# Patient Record
Sex: Female | Born: 1960 | Race: Black or African American | Hispanic: No | Marital: Married | Smoking: Current every day smoker
Health system: Southern US, Community
[De-identification: ages and names within clinical notes are randomized; demographics above are authoritative.]

## PROBLEM LIST (undated history)

## (undated) DIAGNOSIS — I10 Essential (primary) hypertension: Secondary | ICD-10-CM

## (undated) DIAGNOSIS — R42 Dizziness and giddiness: Secondary | ICD-10-CM

## (undated) DIAGNOSIS — M1990 Unspecified osteoarthritis, unspecified site: Secondary | ICD-10-CM

## (undated) DIAGNOSIS — I499 Cardiac arrhythmia, unspecified: Secondary | ICD-10-CM

## (undated) HISTORY — DX: Unspecified osteoarthritis, unspecified site: M19.90

## (undated) HISTORY — PX: MOLE REMOVAL: SHX2046

## (undated) HISTORY — DX: Cardiac arrhythmia, unspecified: I49.9

---

## 1998-10-07 ENCOUNTER — Encounter: Admission: RE | Admit: 1998-10-07 | Discharge: 1998-10-07 | Payer: Self-pay | Admitting: Internal Medicine

## 1998-12-31 ENCOUNTER — Other Ambulatory Visit: Admission: RE | Admit: 1998-12-31 | Discharge: 1998-12-31 | Payer: Self-pay | Admitting: Obstetrics & Gynecology

## 2000-02-06 ENCOUNTER — Inpatient Hospital Stay (HOSPITAL_COMMUNITY): Admission: AD | Admit: 2000-02-06 | Discharge: 2000-02-06 | Payer: Self-pay | Admitting: Obstetrics

## 2003-09-22 ENCOUNTER — Emergency Department (HOSPITAL_COMMUNITY): Admission: EM | Admit: 2003-09-22 | Discharge: 2003-09-22 | Payer: Self-pay | Admitting: Emergency Medicine

## 2010-05-04 ENCOUNTER — Emergency Department (HOSPITAL_COMMUNITY): Admission: EM | Admit: 2010-05-04 | Discharge: 2010-05-04 | Payer: Self-pay | Admitting: Emergency Medicine

## 2010-10-15 LAB — CBC
MCH: 31.2 pg (ref 26.0–34.0)
MCHC: 34.1 g/dL (ref 30.0–36.0)
Platelets: 270 10*3/uL (ref 150–400)
RBC: 4.49 MIL/uL (ref 3.87–5.11)

## 2010-10-15 LAB — POCT I-STAT, CHEM 8
HCT: 44 % (ref 36.0–46.0)
Hemoglobin: 15 g/dL (ref 12.0–15.0)
Sodium: 135 mEq/L (ref 135–145)
TCO2: 21 mmol/L (ref 0–100)

## 2010-10-15 LAB — DIFFERENTIAL
Basophils Relative: 0 % (ref 0–1)
Eosinophils Absolute: 0.2 10*3/uL (ref 0.0–0.7)
Lymphs Abs: 2.2 10*3/uL (ref 0.7–4.0)
Neutrophils Relative %: 73 % (ref 43–77)

## 2010-10-15 LAB — GLUCOSE, CAPILLARY: Glucose-Capillary: 116 mg/dL — ABNORMAL HIGH (ref 70–99)

## 2012-06-15 ENCOUNTER — Encounter (HOSPITAL_COMMUNITY): Payer: Self-pay | Admitting: Emergency Medicine

## 2012-06-15 ENCOUNTER — Emergency Department (INDEPENDENT_AMBULATORY_CARE_PROVIDER_SITE_OTHER)
Admission: EM | Admit: 2012-06-15 | Discharge: 2012-06-15 | Disposition: A | Discharge status: Home / Self Care | Payer: Self-pay | Source: Home / Self Care | Attending: Emergency Medicine | Admitting: Emergency Medicine

## 2012-06-15 DIAGNOSIS — R21 Rash and other nonspecific skin eruption: Secondary | ICD-10-CM

## 2012-06-15 MED ORDER — CETIRIZINE HCL 10 MG PO CHEW: 10.0000 mg | CHEWABLE_TABLET | Freq: Every day | ORAL | Status: DC

## 2012-06-15 MED ORDER — PERMETHRIN 5 % EX CREA: TOPICAL_CREAM | CUTANEOUS | Status: DC

## 2012-06-15 MED ORDER — IPRATROPIUM BROMIDE 0.02 % IN SOLN: 0.5000 mg | Freq: Once | RESPIRATORY_TRACT | Status: DC

## 2012-06-15 MED ORDER — PREDNISONE 20 MG PO TABS: 60.0000 mg | ORAL_TABLET | Freq: Every day | ORAL | Status: DC

## 2012-06-15 MED ORDER — ALBUTEROL SULFATE (5 MG/ML) 0.5% IN NEBU: 2.5000 mg | INHALATION_SOLUTION | Freq: Once | RESPIRATORY_TRACT | Status: DC

## 2012-06-15 MED ORDER — DIPHENHYDRAMINE-ZINC ACETATE 1-0.1 % EX CREA: TOPICAL_CREAM | Freq: Three times a day (TID) | CUTANEOUS | Status: DC | PRN

## 2012-06-15 NOTE — ED Notes (Signed)
Areas of itching skin for 4 days.  Spouse is itching, but not as bad as patient, no rash noticed.  Patient has small bumps scattered on trunk and legs.  Areas itch.  Patient has bumps on fingers

## 2012-06-15 NOTE — ED Provider Notes (Signed)
History     CSN: [id number]  Arrival date & time 06/15/12  1547   First MD Initiated Contact with Patient 06/15/12 1551      Chief Complaint  Patient presents with  . Pruritis    (Consider location/radiation/quality/duration/timing/severity/associated sxs/prior treatment) The history is provided by the patient.  This patient complains of a pruritic rash.  States she believes it is related to an old blanket she took out of her brothers car.   Location: bilateral forearms, chest and bilateral inner thigh  Onset: 4 days ago   Course: unchanged Self-treated with: anti-itch cream             Improvement with treatment: no  History Itching: yes  Tenderness: no  New medications/antibiotics: no  Pet exposure: no  Recent travel or tropical exposure: no  New soaps, shampoos, detergent, clothing: no Tick/insect exposure: no   Red Flags Feeling ill: no Fever:no Facial/tongue swelling/difficulty breathing:  no  Diabetic or immunocompromised: no   History reviewed. No pertinent past medical history.  History reviewed. No pertinent past surgical history.  No family history on file.  History  Substance Use Topics  . Smoking status: Current Every Day Smoker  . Smokeless tobacco: Not on file  . Alcohol Use: Yes    OB History    Grav Para Term Preterm Abortions TAB SAB Ect Mult Living                  Review of Systems  Skin: Positive for rash.  All other systems reviewed and are negative.    Allergies  Review of patient's allergies indicates no known allergies.  Home Medications   Current Outpatient Rx  Name  Route  Sig  Dispense  Refill  . CETIRIZINE HCL 10 MG PO CHEW   Oral   Chew 1 tablet (10 mg total) by mouth daily.   30 tablet   0   . DIPHENHYDRAMINE-ZINC ACETATE 1-0.1 % EX CREA   Topical   Apply topically 3 (three) times daily as needed for itching.   28.3 g   0   . PERMETHRIN 5 % EX CREA      Apply to affected area once   10 g   0      BP 153/91  Pulse 91  Temp 98 F (36.7 C) (Oral)  Resp 18  SpO2 99%  LMP 05/15/2012  Physical Exam  Nursing note and vitals reviewed. Constitutional: She is oriented to person, place, and time. Vital signs are normal. She appears well-developed and well-nourished. She is active and cooperative.  HENT:  Head: Normocephalic.  Eyes: Conjunctivae normal are normal. Pupils are equal, round, and reactive to light. No scleral icterus.  Neck: Trachea normal. Neck supple.  Cardiovascular: Normal rate, regular rhythm, normal heart sounds and intact distal pulses.   Pulmonary/Chest: Effort normal and breath sounds normal.  Neurological: She is alert and oriented to person, place, and time. No cranial nerve deficit or sensory deficit.  Skin: Skin is warm, dry and intact. Rash noted.       Diffuse papular erythematous and linear rash on arms and trunk  Psychiatric: She has a normal mood and affect. Her speech is normal and behavior is normal. Judgment and thought content normal. Cognition and memory are normal.    ED Course  Procedures (including critical care time)  Labs Reviewed - No data to display No results found.   1. Rash and nonspecific skin eruption  MDM  Cool showers; avoid heat, sunlight and anything that makes condition worse.  Zyrtec for at least seven days.  Begin benadryl cream.  RTC if symptoms do not improve or begin to have problems swallowing, breathing or significant change in condition.   Follow up with primary care provider for blood pressure management.       Johnsie Kindred, NP 06/15/12 1735  Johnsie Kindred, NP 06/15/12 1735

## 2012-06-16 NOTE — ED Provider Notes (Signed)
Medical screening examination/treatment/procedure(s) were performed by non-physician practitioner and as supervising physician I was immediately available for consultation/collaboration.  Leslee Home, M.D.   Reuben Likes, MD 06/16/12 [phone]

## 2016-08-18 ENCOUNTER — Encounter (HOSPITAL_COMMUNITY): Payer: Self-pay | Admitting: Emergency Medicine

## 2016-08-18 DIAGNOSIS — M542 Cervicalgia: Secondary | ICD-10-CM | POA: Insufficient documentation

## 2016-08-18 DIAGNOSIS — F17210 Nicotine dependence, cigarettes, uncomplicated: Secondary | ICD-10-CM | POA: Insufficient documentation

## 2016-08-18 DIAGNOSIS — R51 Headache: Secondary | ICD-10-CM | POA: Diagnosis not present

## 2016-08-18 NOTE — ED Triage Notes (Signed)
Pt states she slept all of Thursday night on her left shoulder which caused her to be very sore in that area; since Thursday, pain has began radiating to her neck and into her head; ambulatory and A&Ox4; Hypertensive in triage; states she has not been to the doctor in "10-15 years"; denies blurred vision

## 2016-08-19 ENCOUNTER — Emergency Department (HOSPITAL_COMMUNITY)
Admission: EM | Admit: 2016-08-19 | Discharge: 2016-08-19 | Disposition: A | Discharge status: Home / Self Care | Payer: Medicaid Other | Attending: Emergency Medicine | Admitting: Emergency Medicine

## 2016-08-19 ENCOUNTER — Encounter (HOSPITAL_COMMUNITY): Payer: Self-pay

## 2016-08-19 ENCOUNTER — Emergency Department (HOSPITAL_COMMUNITY): Payer: Medicaid Other

## 2016-08-19 DIAGNOSIS — M542 Cervicalgia: Secondary | ICD-10-CM

## 2016-08-19 DIAGNOSIS — R51 Headache: Secondary | ICD-10-CM

## 2016-08-19 DIAGNOSIS — R519 Headache, unspecified: Secondary | ICD-10-CM

## 2016-08-19 LAB — CBC WITH DIFFERENTIAL/PLATELET
Basophils Absolute: 0 10*3/uL (ref 0.0–0.1)
Basophils Relative: 0 %
EOS PCT: 2 %
Eosinophils Absolute: 0.2 10*3/uL (ref 0.0–0.7)
HCT: 47.6 % — ABNORMAL HIGH (ref 36.0–46.0)
Hemoglobin: 16.1 g/dL — ABNORMAL HIGH (ref 12.0–15.0)
LYMPHS ABS: 2.9 10*3/uL (ref 0.7–4.0)
LYMPHS PCT: 26 %
MCH: 32.3 pg (ref 26.0–34.0)
MCHC: 33.8 g/dL (ref 30.0–36.0)
MCV: 95.6 fL (ref 78.0–100.0)
MONO ABS: 0.7 10*3/uL (ref 0.1–1.0)
Monocytes Relative: 6 %
Neutro Abs: 7.3 10*3/uL (ref 1.7–7.7)
Neutrophils Relative %: 66 %
PLATELETS: 281 10*3/uL (ref 150–400)
RBC: 4.98 MIL/uL (ref 3.87–5.11)
RDW: 14.6 % (ref 11.5–15.5)
WBC: 11.1 10*3/uL — ABNORMAL HIGH (ref 4.0–10.5)

## 2016-08-19 LAB — I-STAT TROPONIN, ED: Troponin i, poc: 0 ng/mL (ref 0.00–0.08)

## 2016-08-19 LAB — BASIC METABOLIC PANEL
Anion gap: 9 (ref 5–15)
BUN: 8 mg/dL (ref 6–20)
CHLORIDE: 104 mmol/L (ref 101–111)
CO2: 26 mmol/L (ref 22–32)
CREATININE: 0.78 mg/dL (ref 0.44–1.00)
Calcium: 9.7 mg/dL (ref 8.9–10.3)
GFR calc non Af Amer: >60 mL/min (ref >60)
Glucose, Bld: 87 mg/dL (ref 65–99)
POTASSIUM: 3.5 mmol/L (ref 3.5–5.1)
Sodium: 139 mmol/L (ref 135–145)

## 2016-08-19 MED ORDER — IOPAMIDOL (ISOVUE-300) INJECTION 61%
75.0000 mL | Freq: Once | INTRAVENOUS | Status: AC | PRN
  Administered 2016-08-19: 75 mL via INTRAVENOUS

## 2016-08-19 MED ORDER — MORPHINE SULFATE (PF) 4 MG/ML IV SOLN
4.0000 mg | Freq: Once | INTRAVENOUS | Status: AC
  Administered 2016-08-19: 4 mg via INTRAVENOUS
  Filled 2016-08-19: qty 1

## 2016-08-19 MED ORDER — HYDROCODONE-ACETAMINOPHEN 5-325 MG PO TABS: 1.0000 | ORAL_TABLET | Freq: Four times a day (QID) | ORAL | 0 refills | Status: DC | PRN

## 2016-08-19 MED ORDER — IOPAMIDOL (ISOVUE-300) INJECTION 61%
INTRAVENOUS | Status: AC
  Administered 2016-08-19: 75 mL via INTRAVENOUS
  Filled 2016-08-19: qty 75

## 2016-08-19 MED ORDER — NAPROXEN 500 MG PO TABS: 500.0000 mg | ORAL_TABLET | Freq: Two times a day (BID) | ORAL | 0 refills | Status: DC

## 2016-08-19 NOTE — ED Provider Notes (Signed)
[facility] DEPT Provider Note   CSN: [id number] Arrival date & time: 08/18/16  2016  By signing my name below, I, Oleh Genin, attest that this documentation has been prepared under the direction and in the presence of Veryl Speak, MD. Electronically Signed: Oleh Genin, Scribe. 08/19/16. 2:43 AM.   History   Chief Complaint Chief Complaint  Patient presents with  . Neck Pain  . Shoulder Pain    HPI Jean Daugherty is a 56 y.o. female who does not see a physician regularly presents to the ED for evaluation of neck pain. This patient states that 4 days ago she "woke after lying awkwardly on her R arm". Since that time she has experienced constant R shoulder pain as well as anterior neck pain and headaches. She is taking Tylenol at home. She has been ambulatory. She is able to fully range her R shoulder. She denies any changes in vision or changes in sensation or motor function distally. She otherwise denies any fevers, vomiting, abdominal pain, congestion, cough, or rhinorrhea. She has no other concerns at this time.   The history is provided by the patient. No language interpreter was used.    History reviewed. No pertinent past medical history.  There are no active problems to display for this patient.   History reviewed. No pertinent surgical history.  OB History    No data available       Home Medications    Prior to Admission medications   Medication Sig Start Date End Date Taking? Authorizing Provider  cetirizine (ZYRTEC) 10 MG chewable tablet Chew 1 tablet (10 mg total) by mouth daily. 06/15/12   Awilda Metro, NP  diphenhydrAMINE-zinc acetate (BENADRYL) cream Apply topically 3 (three) times daily as needed for itching. 06/15/12   Awilda Metro, NP  permethrin (ELIMITE) 5 % cream Apply to affected area once 06/15/12   Awilda Metro, NP    Family History No family history on file.  Social History Social History  Substance Use Topics  .  Smoking status: Current Every Day Smoker    Packs/day: 0.50    Types: Cigarettes  . Smokeless tobacco: Never Used  . Alcohol use Yes     Comment: 3x per week     Allergies   Patient has no known allergies.   Review of Systems Review of Systems  Constitutional: Negative for chills and fever.  HENT: Negative for congestion and rhinorrhea.   Respiratory: Negative for cough.   Musculoskeletal:       R shoulder pain. Anterior neck pain  Neurological: Positive for headaches.  All other systems reviewed and are negative.    Physical Exam Updated Vital Signs BP (!) 182/101 (BP Location: Left Arm)   Pulse 86   Temp 97.6 F (36.4 C) (Oral)   Resp 18   LMP 08/18/2016 Comment: spotting  SpO2 98%   Physical Exam  Constitutional: She is oriented to person, place, and time. She appears well-developed and well-nourished.  Appears uncomfortable and moaning.   HENT:  Head: Normocephalic and atraumatic.  Mouth/Throat: Oropharynx is clear and moist.  Neck: Normal range of motion. Neck supple. No tracheal deviation present.  Cardiovascular: Normal rate, regular rhythm and normal heart sounds.   Pulmonary/Chest: Effort normal and breath sounds normal. No stridor. No respiratory distress. She has no wheezes. She has no rales.  Abdominal: Soft.  Lymphadenopathy:    She has no cervical adenopathy.  Neurological: She is alert and oriented to person, place, and time.  Skin: Skin is warm and dry.  Psychiatric: She has a normal mood and affect.  Nursing note and vitals reviewed.    ED Treatments / Results  DIAGNOSTIC STUDIES: Oxygen Saturation is 98 percent on room air which is normal by my interpretation.    COORDINATION OF CARE: 2:43 AM Discussed treatment plan with pt at bedside and pt agreed to plan.  Labs (all labs ordered are listed, but only abnormal results are displayed) Labs Reviewed - No data to display  EKG  EKG Interpretation None       Radiology No results  found.  Procedures Procedures (including critical care time)  Medications Ordered in ED Medications - No data to display   Initial Impression / Assessment and Plan / ED Course  I have reviewed the triage vital signs and the nursing notes.  Pertinent labs & imaging results that were available during my care of the patient were reviewed by me and considered in my medical decision making (see chart for details).  Clinical Course     Patient is a 56 year old female who presents with severe pain in her neck and upper chest radiating into her head. This started 3 days ago after waking from sleep. Her workup today reveals no evidence for an emergent situation. Her EKG and troponin are negative and I highly doubt a cardiac etiology. CT scan of the head and neck are both negative. I see nothing in the workup that indicates an emergent situation. She will be discharged with anti-inflammatories, pain medication, and when necessary follow-up with a primary doctor.  Final Clinical Impressions(s) / ED Diagnoses   Final diagnoses:  None    New Prescriptions New Prescriptions   No medications on file  I personally performed the services described in this documentation, which was scribed in my presence. The recorded information has been reviewed and is accurate.       Veryl Speak, MD 08/19/16 [phone]

## 2016-08-19 NOTE — Discharge Instructions (Signed)
Naproxen as prescribed.  Hydrocodone as prescribed as needed for pain not relieved with naproxen.  Follow-up with your primary Dr. if not improving in the next week, and return to the ER if your symptoms significantly worsen or change.

## 2016-08-19 NOTE — ED Notes (Signed)
Patient transported to CT 

## 2016-08-19 NOTE — ED Notes (Signed)
Patient returned from CT

## 2017-05-03 ENCOUNTER — Ambulatory Visit (HOSPITAL_COMMUNITY)
Admission: EM | Admit: 2017-05-03 | Discharge: 2017-05-03 | Disposition: A | Discharge status: Home / Self Care | Payer: Medicaid Other | Attending: Internal Medicine | Admitting: Internal Medicine

## 2017-05-03 ENCOUNTER — Encounter (HOSPITAL_COMMUNITY): Payer: Self-pay | Admitting: *Deleted

## 2017-05-03 DIAGNOSIS — F17210 Nicotine dependence, cigarettes, uncomplicated: Secondary | ICD-10-CM | POA: Diagnosis not present

## 2017-05-03 DIAGNOSIS — R030 Elevated blood-pressure reading, without diagnosis of hypertension: Secondary | ICD-10-CM | POA: Diagnosis not present

## 2017-05-03 DIAGNOSIS — L0102 Bockhart's impetigo: Secondary | ICD-10-CM | POA: Diagnosis not present

## 2017-05-03 LAB — CBC WITH DIFFERENTIAL/PLATELET
BASOS ABS: 0 10*3/uL (ref 0.0–0.1)
BASOS PCT: 0 %
EOS PCT: 2 %
Eosinophils Absolute: 0.2 10*3/uL (ref 0.0–0.7)
HEMATOCRIT: 47.2 % — AB (ref 36.0–46.0)
Hemoglobin: 14.9 g/dL (ref 12.0–15.0)
Lymphocytes Relative: 36 %
Lymphs Abs: 2.6 10*3/uL (ref 0.7–4.0)
MCH: 30.6 pg (ref 26.0–34.0)
MCHC: 31.6 g/dL (ref 30.0–36.0)
MCV: 96.9 fL (ref 78.0–100.0)
MONO ABS: 0.5 10*3/uL (ref 0.1–1.0)
MONOS PCT: 6 %
NEUTROS ABS: 4.1 10*3/uL (ref 1.7–7.7)
Neutrophils Relative %: 56 %
PLATELETS: 282 10*3/uL (ref 150–400)
RBC: 4.87 MIL/uL (ref 3.87–5.11)
RDW: 14.4 % (ref 11.5–15.5)
WBC: 7.4 10*3/uL (ref 4.0–10.5)

## 2017-05-03 LAB — COMPREHENSIVE METABOLIC PANEL
ALBUMIN: 4.1 g/dL (ref 3.5–5.0)
ALT: 27 U/L (ref 14–54)
ANION GAP: 10 (ref 5–15)
AST: 36 U/L (ref 15–41)
Alkaline Phosphatase: 90 U/L (ref 38–126)
BILIRUBIN TOTAL: 0.3 mg/dL (ref 0.3–1.2)
BUN: <5 mg/dL — ABNORMAL LOW (ref 6–20)
CHLORIDE: 104 mmol/L (ref 101–111)
CO2: 24 mmol/L (ref 22–32)
Calcium: 8.8 mg/dL — ABNORMAL LOW (ref 8.9–10.3)
Creatinine, Ser: 0.74 mg/dL (ref 0.44–1.00)
GFR calc Af Amer: >60 mL/min (ref >60)
GLUCOSE: 100 mg/dL — AB (ref 65–99)
POTASSIUM: 4.3 mmol/L (ref 3.5–5.1)
Sodium: 138 mmol/L (ref 135–145)
TOTAL PROTEIN: 7.7 g/dL (ref 6.5–8.1)

## 2017-05-03 LAB — SEDIMENTATION RATE: SED RATE: 7 mm/h (ref 0–22)

## 2017-05-03 MED ORDER — METHYLPREDNISOLONE SODIUM SUCC 125 MG IJ SOLR
125.0000 mg | Freq: Once | INTRAMUSCULAR | Status: AC
  Administered 2017-05-03: 125 mg via INTRAMUSCULAR

## 2017-05-03 MED ORDER — CEFTRIAXONE SODIUM 1 G IJ SOLR
1.0000 g | Freq: Once | INTRAMUSCULAR | Status: AC
Start: 2017-05-03 — End: 2017-05-03
  Administered 2017-05-03: 1 g via INTRAMUSCULAR

## 2017-05-03 MED ORDER — CEPHALEXIN 500 MG PO CAPS: 500.0000 mg | ORAL_CAPSULE | Freq: Two times a day (BID) | ORAL | 0 refills | Status: AC

## 2017-05-03 MED ORDER — METHYLPREDNISOLONE SODIUM SUCC 125 MG IJ SOLR
INTRAMUSCULAR | Status: AC
  Filled 2017-05-03: qty 2

## 2017-05-03 MED ORDER — PREDNISONE 20 MG PO TABS: 20.0000 mg | ORAL_TABLET | Freq: Every day | ORAL | 0 refills | Status: DC

## 2017-05-03 MED ORDER — CEFTRIAXONE SODIUM 1 G IJ SOLR
INTRAMUSCULAR | Status: AC
  Filled 2017-05-03: qty 10

## 2017-05-03 MED ORDER — SULFAMETHOXAZOLE-TRIMETHOPRIM 800-160 MG PO TABS: 1.0000 | ORAL_TABLET | Freq: Two times a day (BID) | ORAL | 0 refills | Status: DC

## 2017-05-03 MED ORDER — SULFAMETHOXAZOLE-TRIMETHOPRIM 800-160 MG PO TABS: 1.0000 | ORAL_TABLET | Freq: Two times a day (BID) | ORAL | 0 refills | Status: AC

## 2017-05-03 NOTE — ED Provider Notes (Signed)
[facility]    CSN: [id number] Arrival date & time: 05/03/17  1001     History   Chief Complaint Chief Complaint  Patient presents with  . Insect Bite    HPI Jean Daugherty is a 56 y.o. female. Patient presents today with a one-week history of itchy papules and pustules over the extremities, getting worse. May have started with some mosquito bites. No new exposures, has not been spending a lot of time in the yard, no pets. No fever, no malaise. Blood pressure is elevated, and patient has been absent from care for some time due to insurance issues. Not known to be diabetic. Positive family history of diabetes.   HPI  History reviewed. No pertinent past medical history.  History reviewed. No pertinent surgical history.   Home Medications    Prior to Admission medications   Medication Sig Start Date End Date Taking? Authorizing Provider  cephALEXin (KEFLEX) 500 MG capsule Take 1 capsule (500 mg total) by mouth 2 (two) times daily. 05/03/17 05/10/17  Sherlene Shams, MD  diphenhydramine-acetaminophen (TYLENOL PM) 25-500 MG TABS tablet Take 1 tablet by mouth at bedtime as needed (sleep).    [provider]  HYDROcodone-acetaminophen (NORCO) 5-325 MG tablet Take 1-2 tablets by mouth every 6 (six) hours as needed. 08/19/16   Veryl Speak, MD  naproxen (NAPROSYN) 500 MG tablet Take 1 tablet (500 mg total) by mouth 2 (two) times daily. 08/19/16   Veryl Speak, MD  predniSONE (DELTASONE) 20 MG tablet Take 1 tablet (20 mg total) by mouth daily. 3 tabs qd x 3d then 2 tabs qd x 3d then 1 tab qd x 3d then 0.5 tab qd x 4d then stop. 05/03/17   Sherlene Shams, MD  sulfamethoxazole-trimethoprim (BACTRIM DS,SEPTRA DS) 800-160 MG tablet Take 1 tablet by mouth 2 (two) times daily. 05/03/17 05/10/17  Sherlene Shams, MD    Family History History reviewed. No pertinent family history.  Social History Social History  Substance Use Topics  . Smoking status: Current Every Day  Smoker    Packs/day: 0.50    Types: Cigarettes  . Smokeless tobacco: Never Used  . Alcohol use Yes     Comment: 3x per week     Allergies   Patient has no known allergies.   Review of Systems Review of Systems  All other systems reviewed and are negative.    Physical Exam Triage Vital Signs ED Triage Vitals [05/03/17 1011]  Enc Vitals Group     BP (!) 175/94     Pulse Rate (!) 102     Resp 16     Temp 98.2 F (36.8 C)     Temp Source Oral     SpO2 97 %     Weight      Height      Pain Score      Pain Loc    Updated Vital Signs BP (!) 175/94 (BP Location: Right Arm)   Pulse (!) 102   Temp 98.2 F (36.8 C) (Oral)   Resp 16   SpO2 97%     Physical Exam  Constitutional: She is oriented to person, place, and time. No distress.  HENT:  Head: Atraumatic.  Eyes:  Conjugate gaze observed, no eye redness/discharge  Neck: Neck supple.  Cardiovascular: Normal rate and regular rhythm.   Pulmonary/Chest: No respiratory distress. She has no wheezes. She has no rales.  Lungs clear, symmetric breath sounds  Abdominal: She exhibits no distension.  Musculoskeletal: Normal range of motion.  Neurological: She is alert and oriented to person, place, and time.  Skin: Skin is warm and dry.  Very numerous red papules and pustules over the proximal and distal upper and lower extremities. A few are excoriated. On the left lateral shin there are numerous hyperpigmented marks from a previous episode of similar rash 2 years ago. Not painful, itchy. Erythema is mostly blanching.  Nursing note and vitals reviewed.    UC Treatments / Results  Labs Results for orders placed or performed during the hospital encounter of 05/03/17  CBC with Differential  Result Value Ref Range   WBC 7.4 4.0 - 10.5 K/uL   RBC 4.87 3.87 - 5.11 MIL/uL   Hemoglobin 14.9 12.0 - 15.0 g/dL   HCT 47.2 (H) 36.0 - 46.0 %   MCV 96.9 78.0 - 100.0 fL   MCH 30.6 26.0 - 34.0 pg   MCHC 31.6 30.0 - 36.0 g/dL    RDW 14.4 11.5 - 15.5 %   Platelets 282 150 - 400 K/uL   Neutrophils Relative % 56 %   Neutro Abs 4.1 1.7 - 7.7 K/uL   Lymphocytes Relative 36 %   Lymphs Abs 2.6 0.7 - 4.0 K/uL   Monocytes Relative 6 %   Monocytes Absolute 0.5 0.1 - 1.0 K/uL   Eosinophils Relative 2 %   Eosinophils Absolute 0.2 0.0 - 0.7 K/uL   Basophils Relative 0 %   Basophils Absolute 0.0 0.0 - 0.1 K/uL  Comprehensive metabolic panel  Result Value Ref Range   Sodium 138 135 - 145 mmol/L   Potassium 4.3 3.5 - 5.1 mmol/L   Chloride 104 101 - 111 mmol/L   CO2 24 22 - 32 mmol/L   Glucose, Bld 100 (H) 65 - 99 mg/dL   BUN <5 (L) 6 - 20 mg/dL   Creatinine, Ser 0.74 0.44 - 1.00 mg/dL   Calcium 8.8 (L) 8.9 - 10.3 mg/dL   Total Protein 7.7 6.5 - 8.1 g/dL   Albumin 4.1 3.5 - 5.0 g/dL   AST 36 15 - 41 U/L   ALT 27 14 - 54 U/L   Alkaline Phosphatase 90 38 - 126 U/L   Total Bilirubin 0.3 0.3 - 1.2 mg/dL   GFR calc non Af Amer >60 >60 mL/min   GFR calc Af Amer >60 >60 mL/min   Anion gap 10 5 - 15  Sedimentation rate  Result Value Ref Range   Sed Rate 7 0 - 22 mm/hr    Procedures Procedures (including critical care time)  Medications Ordered in UC Medications  methylPREDNISolone sodium succinate (SOLU-MEDROL) 125 mg/2 mL injection 125 mg (125 mg Intramuscular Given 05/03/17 1042)  cefTRIAXone (ROCEPHIN) injection 1 g (1 g Intramuscular Given 05/03/17 1042)     Final Clinical Impressions(s) / UC Diagnoses   Final diagnoses:  Impetigo follicularis  Elevated blood pressure reading   Injections of solumedrol (a steroid, for inflammation/itching) and rocephin (antibiotic) were given at the urgent care today.  Prescriptions for prednisone (steroid) and for cephalexin and trimethoprim/sulfa (antibiotics active against strep and staph germs) were sent to the pharmacy.  Finish the antibiotics.  The urgent care will contact you if labwork (blood count, chemistries/liver/kidney tests, sed rate) done today needs further  action before appointment with primary care provider.  Keep followup appointment with your new primary care provider on 10/12, to establish care and determine if further evaluation/treatment for rash is needed.  Recheck or go to ED if new  fever >100.5 or marked worsening of rash/painful rash.   New Prescriptions Discharge Medication List as of 05/03/2017 10:43 AM    START taking these medications   Details  cephALEXin (KEFLEX) 500 MG capsule Take 1 capsule (500 mg total) by mouth 2 (two) times daily., Starting Mon 05/03/2017, Until Mon 05/10/2017, Normal    predniSONE (DELTASONE) 20 MG tablet Take 1 tablet (20 mg total) by mouth daily. 3 tabs qd x 3d then 2 tabs qd x 3d then 1 tab qd x 3d then 0.5 tab qd x 4d then stop., Starting Mon 05/03/2017, Normal         Controlled Substance Prescriptions [place] Controlled Substance Registry consulted? No   Sherlene Shams, MD 05/05/17 2117

## 2017-05-03 NOTE — Discharge Instructions (Addendum)
Injections of solumedrol (a steroid, for inflammation/itching) and rocephin (antibiotic) were given at the urgent care today.  Prescriptions for prednisone (steroid) and for cephalexin and trimethoprim/sulfa (antibiotics active against strep and staph germs) were sent to the pharmacy.  Finish the antibiotics.  The urgent care will contact you if labwork (blood count, chemistries/liver/kidney tests, sed rate) done today needs further action before appointment with primary care provider.  Keep followup appointment with your new primary care provider on 10/12, to establish care and determine if further evaluation/treatment for rash is needed.  Recheck or go to ED if new fever >100.5 or marked worsening of rash/painful rash.

## 2017-05-03 NOTE — ED Triage Notes (Signed)
Patient reports week long history of "bites" to arms and legs. Open areas noted on arms and legs from scratching. Reports intermittent itching. Reports nothing new in environment.

## 2018-01-12 ENCOUNTER — Other Ambulatory Visit: Payer: Self-pay | Admitting: Physician Assistant

## 2018-01-12 DIAGNOSIS — Z1231 Encounter for screening mammogram for malignant neoplasm of breast: Secondary | ICD-10-CM

## 2018-08-17 ENCOUNTER — Other Ambulatory Visit: Payer: Self-pay | Admitting: Physician Assistant

## 2018-08-17 DIAGNOSIS — N6310 Unspecified lump in the right breast, unspecified quadrant: Secondary | ICD-10-CM

## 2018-08-26 ENCOUNTER — Other Ambulatory Visit: Payer: Self-pay | Admitting: Physician Assistant

## 2018-08-26 DIAGNOSIS — N6310 Unspecified lump in the right breast, unspecified quadrant: Secondary | ICD-10-CM

## 2019-07-11 ENCOUNTER — Encounter (HOSPITAL_COMMUNITY): Payer: Self-pay

## 2019-07-11 ENCOUNTER — Other Ambulatory Visit: Payer: Self-pay

## 2019-07-11 DIAGNOSIS — F17210 Nicotine dependence, cigarettes, uncomplicated: Secondary | ICD-10-CM | POA: Insufficient documentation

## 2019-07-11 DIAGNOSIS — R0981 Nasal congestion: Secondary | ICD-10-CM | POA: Diagnosis not present

## 2019-07-11 DIAGNOSIS — K529 Noninfective gastroenteritis and colitis, unspecified: Secondary | ICD-10-CM | POA: Diagnosis not present

## 2019-07-11 DIAGNOSIS — N83201 Unspecified ovarian cyst, right side: Secondary | ICD-10-CM | POA: Insufficient documentation

## 2019-07-11 DIAGNOSIS — Z20828 Contact with and (suspected) exposure to other viral communicable diseases: Secondary | ICD-10-CM | POA: Diagnosis not present

## 2019-07-11 DIAGNOSIS — R1013 Epigastric pain: Secondary | ICD-10-CM | POA: Diagnosis present

## 2019-07-11 LAB — CBC
HCT: 55.3 % — ABNORMAL HIGH (ref 36.0–46.0)
Hemoglobin: 18 g/dL — ABNORMAL HIGH (ref 12.0–15.0)
MCH: 33.4 pg (ref 26.0–34.0)
MCHC: 32.5 g/dL (ref 30.0–36.0)
MCV: 102.6 fL — ABNORMAL HIGH (ref 80.0–100.0)
Platelets: 271 10*3/uL (ref 150–400)
RBC: 5.39 MIL/uL — ABNORMAL HIGH (ref 3.87–5.11)
RDW: 13.7 % (ref 11.5–15.5)
WBC: 11.7 10*3/uL — ABNORMAL HIGH (ref 4.0–10.5)
nRBC: 0 % (ref 0.0–0.2)

## 2019-07-11 LAB — I-STAT BETA HCG BLOOD, ED (MC, WL, AP ONLY): I-stat hCG, quantitative: <5 m[IU]/mL (ref <5)

## 2019-07-11 LAB — COMPREHENSIVE METABOLIC PANEL
ALT: 17 U/L (ref 0–44)
AST: 18 U/L (ref 15–41)
Albumin: 3.9 g/dL (ref 3.5–5.0)
Alkaline Phosphatase: 65 U/L (ref 38–126)
Anion gap: 9 (ref 5–15)
BUN: 7 mg/dL (ref 6–20)
CO2: 24 mmol/L (ref 22–32)
Calcium: 9.3 mg/dL (ref 8.9–10.3)
Chloride: 104 mmol/L (ref 98–111)
Creatinine, Ser: 0.76 mg/dL (ref 0.44–1.00)
GFR calc Af Amer: >60 mL/min (ref >60)
GFR calc non Af Amer: >60 mL/min (ref >60)
Glucose, Bld: 99 mg/dL (ref 70–99)
Potassium: 4.1 mmol/L (ref 3.5–5.1)
Sodium: 137 mmol/L (ref 135–145)
Total Bilirubin: 1.2 mg/dL (ref 0.3–1.2)
Total Protein: 7.6 g/dL (ref 6.5–8.1)

## 2019-07-11 LAB — LIPASE, BLOOD: Lipase: 20 U/L (ref 11–51)

## 2019-07-11 MED ORDER — SODIUM CHLORIDE 0.9% FLUSH
3.0000 mL | Freq: Once | INTRAVENOUS | Status: AC
  Administered 2019-07-12: 3 mL via INTRAVENOUS

## 2019-07-11 NOTE — ED Triage Notes (Signed)
Patient arrived stating a few days ago she had [employer], the next day she had abdominal pain. Patient reports diarrhea, nausea, and vomiting. Patient took Pepto bismol yesterday with minimal relief.

## 2019-07-12 ENCOUNTER — Emergency Department (HOSPITAL_COMMUNITY): Payer: Medicaid Other

## 2019-07-12 ENCOUNTER — Emergency Department (HOSPITAL_COMMUNITY)
Admission: EM | Admit: 2019-07-12 | Discharge: 2019-07-12 | Disposition: A | Discharge status: Home / Self Care | Payer: Medicaid Other | Attending: Emergency Medicine | Admitting: Emergency Medicine

## 2019-07-12 DIAGNOSIS — K529 Noninfective gastroenteritis and colitis, unspecified: Secondary | ICD-10-CM

## 2019-07-12 DIAGNOSIS — R1013 Epigastric pain: Secondary | ICD-10-CM

## 2019-07-12 DIAGNOSIS — N83201 Unspecified ovarian cyst, right side: Secondary | ICD-10-CM

## 2019-07-12 HISTORY — DX: Essential (primary) hypertension: I10

## 2019-07-12 LAB — URINALYSIS, ROUTINE W REFLEX MICROSCOPIC
Bacteria, UA: NONE SEEN
Bilirubin Urine: NEGATIVE
Glucose, UA: NEGATIVE mg/dL
Ketones, ur: 5 mg/dL — AB
Nitrite: NEGATIVE
Protein, ur: 30 mg/dL — AB
RBC / HPF: >50 RBC/hpf — ABNORMAL HIGH (ref 0–5)
Specific Gravity, Urine: 1.025 (ref 1.005–1.030)
pH: 5 (ref 5.0–8.0)

## 2019-07-12 LAB — SARS CORONAVIRUS 2 (TAT 6-24 HRS): SARS Coronavirus 2: NEGATIVE

## 2019-07-12 MED ORDER — METOCLOPRAMIDE HCL 5 MG/ML IJ SOLN
10.0000 mg | Freq: Once | INTRAMUSCULAR | Status: AC
  Administered 2019-07-12: 10 mg via INTRAVENOUS
  Filled 2019-07-12: qty 2

## 2019-07-12 MED ORDER — SODIUM CHLORIDE 0.9 % IV BOLUS
1000.0000 mL | Freq: Once | INTRAVENOUS | Status: AC
  Administered 2019-07-12: 1000 mL via INTRAVENOUS

## 2019-07-12 MED ORDER — DIPHENHYDRAMINE HCL 50 MG/ML IJ SOLN
12.5000 mg | Freq: Once | INTRAMUSCULAR | Status: AC
  Administered 2019-07-12: 12.5 mg via INTRAVENOUS
  Filled 2019-07-12: qty 1

## 2019-07-12 MED ORDER — DICYCLOMINE HCL 10 MG/ML IM SOLN: 20.0000 mg | Freq: Once | INTRAMUSCULAR | Status: DC

## 2019-07-12 MED ORDER — HYDROCODONE-ACETAMINOPHEN 5-325 MG PO TABS: 1.0000 | ORAL_TABLET | Freq: Four times a day (QID) | ORAL | 0 refills | Status: DC | PRN

## 2019-07-12 MED ORDER — METOCLOPRAMIDE HCL 10 MG PO TABS: 10.0000 mg | ORAL_TABLET | Freq: Four times a day (QID) | ORAL | 0 refills | Status: DC

## 2019-07-12 MED ORDER — FENTANYL CITRATE (PF) 100 MCG/2ML IJ SOLN
50.0000 ug | Freq: Once | INTRAMUSCULAR | Status: AC
  Administered 2019-07-12: 50 ug via INTRAVENOUS
  Filled 2019-07-12: qty 2

## 2019-07-12 NOTE — ED Notes (Signed)
Triage RN [facility] aware of elevated blood pressure

## 2019-07-12 NOTE — ED Notes (Signed)
Pt unable to provide urine specimen at this time

## 2019-07-12 NOTE — Discharge Instructions (Signed)
Please take medications as prescribed for symptomatic relief of nausea and abdominal pain. If having more than 5 bowel movements daily, recommend Imodium. Please make an appointment with gastroenterology for further evaluation of possible Inflammatory Bowel Disease.   It is important to follow up with gynecology for the recommended pelvic ultrasound to more fully evaluate a cystic type density on the right ovary

## 2019-07-12 NOTE — ED Provider Notes (Signed)
[facility] DEPT Provider Note   CSN: [id number] Arrival date & time: 07/11/19  1854     History   Chief Complaint Chief Complaint  Patient presents with  . Abdominal Pain    HPI Jean Daugherty is a 58 y.o. female.     Patient to ED with epigastric abdominal pain that started 2 days ago. She describes intermittent, sharp pain that comes and goes every 15 minutes. It is nonradiating, associated with nausea without vomiting. No fever, SOB, cough. She states she thought it may be related to having eaten [employer] the day before symptoms started. She feels she might have a sinus infection because she has nasal passage pain and reports malodor when breathing in. She also reports a frontal headache located over the frontal sinuses bilaterally. She is also having approximately 3 a day loose, watery bowel movement. She tried Pepto Bismol at home without relief.   The history is provided by the patient. No language interpreter was used.  Abdominal Pain Associated symptoms: diarrhea and nausea   Associated symptoms: no chest pain, no chills, no cough, no fever, no shortness of breath, no sore throat and no vomiting     Past Medical History:  Diagnosis Date  . Hypertension     There are no active problems to display for this patient.   No past surgical history on file.   OB History   No obstetric history on file.      Home Medications    Prior to Admission medications   Medication Sig Start Date End Date Taking? Authorizing Provider  diphenhydramine-acetaminophen (TYLENOL PM) 25-500 MG TABS tablet Take 1 tablet by mouth at bedtime as needed (sleep).    [provider]  HYDROcodone-acetaminophen (NORCO) 5-325 MG tablet Take 1-2 tablets by mouth every 6 (six) hours as needed. 08/19/16   Veryl Speak, MD  naproxen (NAPROSYN) 500 MG tablet Take 1 tablet (500 mg total) by mouth 2 (two) times daily. 08/19/16   Veryl Speak, MD  predniSONE  (DELTASONE) 20 MG tablet Take 1 tablet (20 mg total) by mouth daily. 3 tabs qd x 3d then 2 tabs qd x 3d then 1 tab qd x 3d then 0.5 tab qd x 4d then stop. 05/03/17   Wynona Luna, MD    Family History No family history on file.  Social History Social History   Tobacco Use  . Smoking status: Current Every Day Smoker    Packs/day: 0.50    Types: Cigarettes  . Smokeless tobacco: Never Used  Substance Use Topics  . Alcohol use: Yes    Comment: 3x per week  . Drug use: No     Allergies   Patient has no known allergies.   Review of Systems Review of Systems  Constitutional: Negative for chills and fever.  HENT: Positive for sinus pain. Negative for sore throat.        See HPI.  Eyes: Negative for visual disturbance.  Respiratory: Negative.  Negative for cough and shortness of breath.   Cardiovascular: Negative.  Negative for chest pain.  Gastrointestinal: Positive for abdominal pain, diarrhea and nausea. Negative for blood in stool and vomiting.  Genitourinary: Negative.  Negative for decreased urine volume.  Musculoskeletal: Negative.  Negative for myalgias.  Skin: Negative.   Neurological: Positive for headaches (over frontal sinuses).     Physical Exam Updated Vital Signs BP (!) 193/105 (BP Location: Left Arm)   Pulse 94   Temp 97.9 F (36.6 C) (Oral)  Resp 17   SpO2 99%   Physical Exam Vitals signs and nursing note reviewed.  Constitutional:      Appearance: She is well-developed.  HENT:     Head: Normocephalic.  Neck:     Musculoskeletal: Normal range of motion and neck supple.  Cardiovascular:     Rate and Rhythm: Normal rate and regular rhythm.     Heart sounds: No murmur.  Pulmonary:     Effort: Pulmonary effort is normal.     Breath sounds: Wheezing present. No rhonchi or rales.  Chest:     Chest wall: No tenderness.  Abdominal:     General: Bowel sounds are normal.     Palpations: Abdomen is soft.     Tenderness: There is no abdominal  tenderness. There is no guarding or rebound.     Comments: There is no reproducible abdominal tenderness to palpation.   Musculoskeletal: Normal range of motion.  Skin:    General: Skin is warm and dry.     Findings: No rash.  Neurological:     Mental Status: She is alert.     Cranial Nerves: No cranial nerve deficit.      ED Treatments / Results  Labs (all labs ordered are listed, but only abnormal results are displayed) Labs Reviewed  CBC - Abnormal; Notable for the following components:      Result Value   WBC 11.7 (*)    RBC 5.39 (*)    Hemoglobin 18.0 (*)    HCT 55.3 (*)    MCV 102.6 (*)    All other components within normal limits  URINALYSIS, ROUTINE W REFLEX MICROSCOPIC - Abnormal; Notable for the following components:   Color, Urine YELLOW (*)    APPearance TURBID (*)    Hgb urine dipstick SMALL (*)    Ketones, ur 5 (*)    Protein, ur 30 (*)    Leukocytes,Ua MODERATE (*)    RBC / HPF >50 (*)    All other components within normal limits  LIPASE, BLOOD  COMPREHENSIVE METABOLIC PANEL  I-STAT BETA HCG BLOOD, ED ([facility], [facility], [facility] ONLY)    EKG None  Radiology Dg Chest Portable 1 View  Result Date: 07/12/2019 CLINICAL DATA:  Wheezing. Shortness of breath. EXAM: PORTABLE CHEST 1 VIEW COMPARISON:  None. FINDINGS: The cardiomediastinal contours are normal. Minimal subsegmental atelectasis at the right lung base. Pulmonary vasculature is normal. No consolidation, pleural effusion, or pneumothorax. No acute osseous abnormalities are seen. IMPRESSION: Minimal subsegmental atelectasis at the right lung base. Electronically Signed   By: Keith Rake M.D.   On: 07/12/2019 06:49    Procedures Procedures (including critical care time)  Medications Ordered in ED Medications  sodium chloride flush (NS) 0.9 % injection 3 mL (3 mLs Intravenous Given 07/12/19 0637)  sodium chloride 0.9 % bolus 1,000 mL (1,000 mLs Intravenous New Bag/Given 07/12/19 0636)  metoCLOPramide (REGLAN)  injection 10 mg (10 mg Intravenous Given 07/12/19 0638)  diphenhydrAMINE (BENADRYL) injection 12.5 mg (12.5 mg Intravenous Given 07/12/19 [id number])     Initial Impression / Assessment and Plan / ED Course  I have reviewed the triage vital signs and the nursing notes.  Pertinent labs & imaging results that were available during my care of the patient were reviewed by me and considered in my medical decision making (see chart for details).        Patient to ED for evaluation of intermittent, q 15 minute abdominal pain, diarrhea, nasal malodor and frontal sinus headache  x 3 days. No fever, cough, sore throat.   She is nontoxic in appearance. NAD, afebrile, hypertensive. She reports being unable to take her medications since becoming symptomatic.   Labs are essentially unremarkable: Mild leukocytosis; hematuria with crystals. No history of kidney stones. Will obtain CT Renal however her presenting pain is atypical for stones. CT may see other abnormalities that may contribute to diagnosis.   IV Reglan provided with relief of pain on 20 minute recheck. Will continue to monitor pain relief. CT pending.   Patient care signed out to [facility], PA-C, pending CT study and re-eval. Anticipate discharge home.   7:50 - CT results show active distal ileitis, and findings concerning for IBD. Will provide symptomatic support for enteritis and refer to GI for further evaluation.   CT also shows a 4 cm right ovarian cystic density. Recommends [place] for further evaluation. This is explained to the patient. Will refer and stress importance of follow up.   She is felt appropriate for discharge home. She has had an elevated pressure in the setting of missed doses. She is unsure what medication she takes. Encouraged to take it when she gets home. Doubt ACS or hypertensive crisis.   Given the sinus symptoms, headache, wheezing, COVID send out testing was performed.    Final Clinical Impressions(s) / ED  Diagnoses   Final diagnoses:  None   1. Epigastric abdominal pain 2. Enteritis 3. Sinus congestion 4. Right ovarian density  ED Discharge Orders    None       Charlann Lange, PA-C 07/12/19 [id number]    Veryl Speak, MD 07/12/19 [phone]

## 2019-07-18 ENCOUNTER — Encounter: Payer: Self-pay | Admitting: Gastroenterology

## 2019-08-24 ENCOUNTER — Ambulatory Visit: Payer: Medicaid Other | Admitting: Gastroenterology

## 2019-09-01 ENCOUNTER — Other Ambulatory Visit (INDEPENDENT_AMBULATORY_CARE_PROVIDER_SITE_OTHER): Payer: Medicaid Other

## 2019-09-01 ENCOUNTER — Ambulatory Visit: Payer: Medicaid Other | Admitting: Gastroenterology

## 2019-09-01 ENCOUNTER — Encounter: Payer: Self-pay | Admitting: Gastroenterology

## 2019-09-01 VITALS — BP 138/90 | HR 100 | Temp 98.4°F | Ht 66.5 in | Wt 170.1 lb

## 2019-09-01 DIAGNOSIS — R1031 Right lower quadrant pain: Secondary | ICD-10-CM | POA: Diagnosis not present

## 2019-09-01 DIAGNOSIS — R933 Abnormal findings on diagnostic imaging of other parts of digestive tract: Secondary | ICD-10-CM

## 2019-09-01 DIAGNOSIS — Z01818 Encounter for other preprocedural examination: Secondary | ICD-10-CM | POA: Diagnosis not present

## 2019-09-01 DIAGNOSIS — R197 Diarrhea, unspecified: Secondary | ICD-10-CM

## 2019-09-01 LAB — C-REACTIVE PROTEIN: CRP: 3.8 mg/dL (ref 0.5–20.0)

## 2019-09-01 LAB — SEDIMENTATION RATE: Sed Rate: 29 mm/hr (ref 0–30)

## 2019-09-01 LAB — TSH: TSH: 4.57 u[IU]/mL — ABNORMAL HIGH (ref 0.35–4.50)

## 2019-09-01 MED ORDER — NA SULFATE-K SULFATE-MG SULF 17.5-3.13-1.6 GM/177ML PO SOLN: 1.0000 | Freq: Once | ORAL | 0 refills | Status: AC

## 2019-09-01 MED ORDER — CIPROFLOXACIN HCL 500 MG PO TABS: 500.0000 mg | ORAL_TABLET | Freq: Two times a day (BID) | ORAL | 0 refills | Status: AC

## 2019-09-01 MED ORDER — METRONIDAZOLE 500 MG PO TABS: 500.0000 mg | ORAL_TABLET | Freq: Two times a day (BID) | ORAL | 0 refills | Status: DC

## 2019-09-01 MED ORDER — DICYCLOMINE HCL 10 MG PO CAPS: 10.0000 mg | ORAL_CAPSULE | Freq: Three times a day (TID) | ORAL | 11 refills | Status: DC

## 2019-09-01 NOTE — Patient Instructions (Signed)
Your provider has requested that you go to the basement level for lab work before leaving today. Press "B" on the elevator. The lab is located at the first door on the left as you exit the elevator.  We have sent the following medications to your pharmacy for you to pick up at your convenience: Cipro, Flagyl, and dicyclomine.   Increase fluid intake. You can drink Pedialyte for nutritional supplement.    You have been scheduled for a colonoscopy. Please follow written instructions given to you at your visit today.  Please pick up your prep supplies at the pharmacy within the next 1-3 days. If you use inhalers (even only as needed), please bring them with you on the day of your procedure.   Normal BMI (Body Mass Index- based on height and weight) is between 19 and 25. Your BMI today is Body mass index is 27.05 kg/m. Marland Kitchen Please consider follow up  regarding your BMI with your Primary Care Provider.  Thank you for choosing me and [facility].  Pricilla Riffle. Dagoberto Ligas., MD., Marval Regal

## 2019-09-01 NOTE — Progress Notes (Signed)
History of Present Illness: This is a 59 year old female referred by Mindi Curling, PA-C for the evaluation of abdominal pain, diarrhea, abnormal CT of ileum.  She relates intermittent and recurrent problems with abdominal pain, diarrhea, decreased appetite and nausea for about 6 weeks.  She relates a 10 pound weight loss.  She was evaluated in the ED on December 9.  CT AP as below.  She states she did well for a few weeks but for the past several days her symptoms have recurred.  She states the abdominal pain is in her right lower quadrant but can go to the central abdomen and radiate across the abdomen intermittently.  Food worsens the symptoms.  She is able to tolerate liquids.  No family history of IBD. Denies constipation, change in stool caliber, melena, hematochezia, vomiting, dysphagia, reflux symptoms, chest pain.   CT AP without contrast 07/12/2019  1. Distal ileitis. 2. Notable submucosal fat deposition within small bowel and colon, a nonspecific finding that is sometimes associated with inflammatory bowel disease (increased concern given the active enteritis). 3. 4 cm right ovarian cystic density. Based on size and post menopausal age elective pelvic ultrasound is recommended. Fibroid uterus. 4. No urolithiasis or other explanation for hematuria    No Known Allergies Outpatient Medications Prior to Visit  Medication Sig Dispense Refill  . bisoprolol-hydrochlorothiazide (ZIAC) 5-6.25 MG tablet Take 1 tablet by mouth daily.    . diphenhydramine-acetaminophen (TYLENOL PM) 25-500 MG TABS tablet Take 1 tablet by mouth at bedtime as needed (sleep).    . latanoprost (XALATAN) 0.005 % ophthalmic solution Place 1 drop into both eyes at bedtime.    . metoCLOPramide (REGLAN) 10 MG tablet Take 1 tablet (10 mg total) by mouth every 6 (six) hours. 30 tablet 0  . HYDROcodone-acetaminophen (NORCO/VICODIN) 5-325 MG tablet Take 1 tablet by mouth every 6 (six) hours as needed for severe pain. 8  tablet 0  . naproxen (NAPROSYN) 500 MG tablet Take 1 tablet (500 mg total) by mouth 2 (two) times daily. 20 tablet 0  . predniSONE (DELTASONE) 20 MG tablet Take 1 tablet (20 mg total) by mouth daily. 3 tabs qd x 3d then 2 tabs qd x 3d then 1 tab qd x 3d then 0.5 tab qd x 4d then stop. 20 tablet 0   No facility-administered medications prior to visit.   Past Medical History:  Diagnosis Date  . Cardiac arrhythmia   . Hypertension    Past Surgical History:  Procedure Laterality Date  . MOLE REMOVAL Right    elbow   Social History   Socioeconomic History  . Marital status: Single    Spouse name: Not on file  . Number of children: 0  . Years of education: Not on file  . Highest education level: Not on file  Occupational History  . Not on file  Tobacco Use  . Smoking status: Current Every Day Smoker    Packs/day: 0.50    Types: Cigarettes  . Smokeless tobacco: Never Used  Substance and Sexual Activity  . Alcohol use: Yes    Comment: 2 per week  . Drug use: No  . Sexual activity: Not on file  Other Topics Concern  . Not on file  Social History Narrative  . Not on file   Social Determinants of Health   Financial Resource Strain:   . Difficulty of Paying Living Expenses: Not on file  Food Insecurity:   . Worried About [employer] of  Food in the Last Year: Not on file  . Ran Out of Food in the Last Year: Not on file  Transportation Needs:   . Lack of Transportation (Medical): Not on file  . Lack of Transportation (Non-Medical): Not on file  Physical Activity:   . Days of Exercise per Week: Not on file  . Minutes of Exercise per Session: Not on file  Stress:   . Feeling of Stress : Not on file  Social Connections:   . Frequency of Communication with Friends and Family: Not on file  . Frequency of Social Gatherings with Friends and Family: Not on file  . Attends Religious Services: Not on file  . Active Member of Clubs or Organizations: Not on file  . Attends Theatre manager Meetings: Not on file  . Marital Status: Not on file   Family History  Problem Relation Age of Onset  . Diabetes Mother   . Kidney disease Mother   . Diabetes Father   . Heart disease Father   . Kidney failure Brother   . Heart failure Brother   . Hypertension Brother   . Heart disease Brother   . Sleep apnea Brother   . Hypertension Brother   . Sleep apnea Brother   . Hypertension Brother   . Hypertension Brother   . Hypertension Brother   . Hypertension Brother       Review of Systems: Pertinent positive and negative review of systems were noted in the above HPI section. All other review of systems were otherwise negative.   Physical Exam: General: Well developed, well nourished, mild distress, uncomfortable Head: Normocephalic and atraumatic Eyes:  sclerae anicteric, EOMI Ears: Normal auditory acuity Mouth: No deformity or lesions Neck: Supple, no masses or thyromegaly Lungs: Clear throughout to auscultation Heart: Regular rate and rhythm; no murmurs, rubs or bruits Abdomen: Soft, RLQ, R flank tenderness with mild diffuse tendernes and non distended. No masses, hepatosplenomegaly or hernias noted. Normal Bowel sounds Rectal: Deferred to colonoscopy Musculoskeletal: Symmetrical with no gross deformities  Skin: No lesions on visible extremities Pulses:  Normal pulses noted Extremities: No clubbing, cyanosis, edema or deformities noted Neurological: Alert oriented x 4, grossly nonfocal Cervical Nodes:  No significant cervical adenopathy Inguinal Nodes: No significant inguinal adenopathy Psychological:  Alert and cooperative. Normal mood and affect   Assessment and Recommendations:  1. RLQ pain, diarrhea, weight loss, abnormal CT of ileum.  Rule out Crohn's ileitis, infectious ileitis and other disorders.  Cipro 500 mg twice daily and Flagyl 500 mg twice daily for 1 week.  Begin dicyclomine 10 mg p.o. 3 times daily.  Full liquid low-fat lactose-free diet  for now and advance to soft diet as tolerated.  CRP, ESR and TSH today.  Schedule colonoscopy. The risks (including bleeding, perforation, infection, missed lesions, medication reactions and possible hospitalization or surgery if complications occur), benefits, and alternatives to colonoscopy with possible biopsy and possible polypectomy were discussed with the patient and they consent to proceed.     cc: Mindi Curling, PA-C [place] [phone]

## 2019-09-08 ENCOUNTER — Telehealth: Payer: Self-pay | Admitting: Gastroenterology

## 2019-09-08 NOTE — Telephone Encounter (Signed)
Patient notified that she should have the procedure to further investigate her abnormal CT.  She asked that I help her reschedule for a later date.  She has been rescheduled for 2/19 and COVID screen for 2/17

## 2019-09-08 NOTE — Telephone Encounter (Signed)
Pt states that medication is working. She wants to know if she still needs a procedure.

## 2019-09-13 ENCOUNTER — Encounter: Payer: Medicaid Other | Admitting: Gastroenterology

## 2019-09-18 ENCOUNTER — Telehealth: Payer: Self-pay | Admitting: Gastroenterology

## 2019-09-18 NOTE — Telephone Encounter (Signed)
Left message for patient to call back  

## 2019-09-19 NOTE — Telephone Encounter (Signed)
Patient wants to reschedule the appt for Friday.  I assisted her in rescheduling the procedure and COVID screen

## 2019-09-21 ENCOUNTER — Ambulatory Visit: Payer: Medicaid Other

## 2019-09-22 ENCOUNTER — Encounter: Payer: Medicaid Other | Admitting: Gastroenterology

## 2019-09-25 ENCOUNTER — Ambulatory Visit: Payer: Medicaid Other | Attending: Family

## 2019-09-25 DIAGNOSIS — Z23 Encounter for immunization: Secondary | ICD-10-CM | POA: Insufficient documentation

## 2019-09-25 NOTE — Progress Notes (Signed)
   Covid-19 Vaccination Clinic  Name:  Luana Herzberger    MRN: [id number] DOB: 1961/06/06  09/25/2019  Ms. Belgard was observed post Covid-19 immunization for 15 minutes without incidence. She was provided with Vaccine Information Sheet and instruction to access the V-Safe system.   Ms. Scherff was instructed to call 911 with any severe reactions post vaccine: Marland Kitchen Difficulty breathing  . Swelling of your face and throat  . A fast heartbeat  . A bad rash all over your body  . Dizziness and weakness    Immunizations Administered    Name Date Dose VIS Date Route   Moderna COVID-19 Vaccine 09/25/2019  1:36 PM 0.5 mL 07/04/2019 Intramuscular   Manufacturer: Moderna   Lot: [id number]   [id number]

## 2019-09-26 ENCOUNTER — Telehealth: Payer: Self-pay | Admitting: Gastroenterology

## 2019-09-26 NOTE — Telephone Encounter (Signed)
Pt stated that she has been experiencing worsening abdominal pain and would like to discuss medications.

## 2019-09-26 NOTE — Telephone Encounter (Signed)
Patient reports abdominal pain.  She has stopped the dicyclomine that was prescribed TID.  She will resume the dicyclomine TID and if her symptoms persist she will call back for alternate treatments or possible medication adjustments.

## 2019-10-17 ENCOUNTER — Other Ambulatory Visit: Payer: Self-pay | Admitting: Gastroenterology

## 2019-10-17 ENCOUNTER — Ambulatory Visit (INDEPENDENT_AMBULATORY_CARE_PROVIDER_SITE_OTHER): Payer: Medicaid Other

## 2019-10-17 DIAGNOSIS — Z1159 Encounter for screening for other viral diseases: Secondary | ICD-10-CM

## 2019-10-17 LAB — SARS CORONAVIRUS 2 (TAT 6-24 HRS): SARS Coronavirus 2: NEGATIVE

## 2019-10-18 ENCOUNTER — Telehealth: Payer: Self-pay | Admitting: Gastroenterology

## 2019-10-18 NOTE — Telephone Encounter (Signed)
I reviewed the instructions again with the patient.  She verbalized understanding of how to take her prep and will be here tomorrow for her colonoscopy

## 2019-10-19 ENCOUNTER — Other Ambulatory Visit: Payer: Self-pay

## 2019-10-19 ENCOUNTER — Encounter: Payer: Self-pay | Admitting: Gastroenterology

## 2019-10-19 ENCOUNTER — Ambulatory Visit (AMBULATORY_SURGERY_CENTER): Payer: Medicaid Other | Admitting: Gastroenterology

## 2019-10-19 VITALS — BP 151/91 | HR 77 | Temp 96.2°F | Resp 13 | Ht 66.5 in | Wt 170.0 lb

## 2019-10-19 DIAGNOSIS — K599 Functional intestinal disorder, unspecified: Secondary | ICD-10-CM

## 2019-10-19 DIAGNOSIS — R634 Abnormal weight loss: Secondary | ICD-10-CM

## 2019-10-19 DIAGNOSIS — K5289 Other specified noninfective gastroenteritis and colitis: Secondary | ICD-10-CM

## 2019-10-19 DIAGNOSIS — K319 Disease of stomach and duodenum, unspecified: Secondary | ICD-10-CM

## 2019-10-19 DIAGNOSIS — R933 Abnormal findings on diagnostic imaging of other parts of digestive tract: Secondary | ICD-10-CM

## 2019-10-19 DIAGNOSIS — R197 Diarrhea, unspecified: Secondary | ICD-10-CM | POA: Diagnosis not present

## 2019-10-19 DIAGNOSIS — D123 Benign neoplasm of transverse colon: Secondary | ICD-10-CM | POA: Diagnosis not present

## 2019-10-19 DIAGNOSIS — D124 Benign neoplasm of descending colon: Secondary | ICD-10-CM | POA: Diagnosis not present

## 2019-10-19 MED ORDER — SODIUM CHLORIDE 0.9 % IV SOLN: 500.0000 mL | Freq: Once | INTRAVENOUS | Status: DC

## 2019-10-19 NOTE — Progress Notes (Signed)
PT taken to PACU. Monitors in place. VSS. Report given to RN. 

## 2019-10-19 NOTE — Op Note (Signed)
[facility] Patient Name: Jean Daugherty Procedure Date: 10/19/2019 10:00 AM MRN: [id number] Endoscopist: Ladene Artist , MD Age: 59 Referring MD:  Date of Birth: [birth year] Gender: Female Account #: [id number] Procedure:                Colonoscopy Indications:              Abnormal CT of the GI tract-small bowel, Clinically                            significant diarrhea of unexplained origin, Weight                            loss Medicines:                Monitored Anesthesia Care Procedure:                Pre-Anesthesia Assessment:                           - Prior to the procedure, a History and Physical                            was performed, and patient medications and                            allergies were reviewed. The patient's tolerance of                            previous anesthesia was also reviewed. The risks                            and benefits of the procedure and the sedation                            options and risks were discussed with the patient.                            All questions were answered, and informed consent                            was obtained. Prior Anticoagulants: The patient has                            taken no previous anticoagulant or antiplatelet                            agents. ASA Grade Assessment: II - A patient with                            mild systemic disease. After reviewing the risks                            and benefits, the patient was deemed in  satisfactory condition to undergo the procedure.                           After obtaining informed consent, the colonoscope                            was passed under direct vision. Throughout the                            procedure, the patient's blood pressure, pulse, and                            oxygen saturations were monitored continuously. The                            Colonoscope was introduced through the anus and                             advanced to the the terminal ileum, with                            identification of the appendiceal orifice and IC                            valve. The colonoscopy was performed without                            difficulty. The patient tolerated the procedure                            well. The quality of the bowel preparation was                            adequate. Scope In: 10:13:30 AM Scope Out: 10:27:00 AM Scope Withdrawal Time: 0 hours 11 minutes 28 seconds  Total Procedure Duration: 0 hours 13 minutes 30 seconds  Findings:                 The perianal and digital rectal examinations were                            normal.                           A diffuse area of mucosa in the terminal ileum was                            mildly erythematous. Biopsies were taken with a                            cold forceps for histology.                           A 6 mm polyp was found in the hepatic flexure. The  polyp was sessile. The polyp was removed with a                            cold snare. Resection and retrieval were complete.                           A 10 mm polyp was found in the descending colon.                            The polyp was sessile. The polyp was removed with a                            hot snare. Resection and retrieval were complete.                           External hemorrhoids were found during                            retroflexion. The hemorrhoids were small.                           The exam was otherwise normal throughout the                            examined colon. Random biopsies obtained throughout. Complications:            No immediate complications. Estimated Blood Loss:     Estimated blood loss was minimal. Impression:               - Erythematous mucosa in the terminal ileum.                            Biopsied.                           - One 6 mm polyp at the hepatic flexure, removed                             with a cold snare. Resected and retrieved.                           - One 10 mm polyp in the descending colon, removed                            with a hot snare. Resected and retrieved.                           - External hemorrhoids.                           - Colonoscopy otherwise normal. Recommendation:           - Patient has a contact number available for  emergencies. The signs and symptoms of potential                            delayed complications were discussed with the                            patient. Return to normal activities tomorrow.                            Written discharge instructions were provided to the                            patient.                           - Resume previous diet.                           - Continue present medications.                           - Await pathology results.                           - No aspirin, ibuprofen, naproxen, or other                            non-steroidal anti-inflammatory drugs for 2 weeks                            after polyp removal.                           - Repeat colonoscopy after studies are complete for                            surveillance based on pathology results with a more                            extensive bowel prep. Ladene Artist, MD 10/19/2019 10:34:31 AM This report has been signed electronically.

## 2019-10-19 NOTE — Patient Instructions (Signed)
NO ASPIRIN, ASPIRIN CONTAINING PRODUCTS (BC OR GOODY POWDERS) OR NSAIDS (IBUPROFEN, ADVIL, ALEVE, AND MOTRIN) FOR 2 weeks after polyp removal; TYLENOL IS OK TO TAKE  Handouts provided on polyps and hemorrhoids.   YOU HAD AN ENDOSCOPIC PROCEDURE TODAY AT [facility]:   Refer to the procedure report that was given to you for any specific questions about what was found during the examination.  If the procedure report does not answer your questions, please call your gastroenterologist to clarify.  If you requested that your care partner not be given the details of your procedure findings, then the procedure report has been included in a sealed envelope for you to review at your convenience later.  YOU SHOULD EXPECT: Some feelings of bloating in the abdomen. Passage of more gas than usual.  Walking can help get rid of the air that was put into your GI tract during the procedure and reduce the bloating. If you had a lower endoscopy (such as a colonoscopy or flexible sigmoidoscopy) you may notice spotting of blood in your stool or on the toilet paper. If you underwent a bowel prep for your procedure, you may not have a normal bowel movement for a few days.  Please Note:  You might notice some irritation and congestion in your nose or some drainage.  This is from the oxygen used during your procedure.  There is no need for concern and it should clear up in a day or so.  SYMPTOMS TO REPORT IMMEDIATELY:   Following lower endoscopy (colonoscopy or flexible sigmoidoscopy):  Excessive amounts of blood in the stool  Significant tenderness or worsening of abdominal pains  Swelling of the abdomen that is new, acute  Fever of 100F or higher   For urgent or emergent issues, a gastroenterologist can be reached at any hour by calling [phone]. Do not use MyChart messaging for urgent concerns.    DIET:  We do recommend a small meal at first, but then you may proceed to your regular diet.   Drink plenty of fluids but you should avoid alcoholic beverages for 24 hours.  ACTIVITY:  You should plan to take it easy for the rest of today and you should NOT DRIVE or use heavy machinery until tomorrow (because of the sedation medicines used during the test).    FOLLOW UP: Our staff will call the number listed on your records 48-72 hours following your procedure to check on you and address any questions or concerns that you may have regarding the information given to you following your procedure. If we do not reach you, we will leave a message.  We will attempt to reach you two times.  During this call, we will ask if you have developed any symptoms of COVID 19. If you develop any symptoms (ie: fever, flu-like symptoms, shortness of breath, cough etc.) before then, please call [phone].  If you test positive for Covid 19 in the 2 weeks post procedure, please call and report this information to [place].    If any biopsies were taken you will be contacted by phone or by letter within the next 1-3 weeks.  Please call us at [phone] if you have not heard about the biopsies in 3 weeks.    SIGNATURES/CONFIDENTIALITY: You and/or your care partner have signed paperwork which will be entered into your electronic medical record.  These signatures attest to the fact that that the information above on your After Visit Summary has been reviewed and  is understood.  Full responsibility of the confidentiality of this discharge information lies with you and/or your care-partner.

## 2019-10-19 NOTE — Progress Notes (Signed)
Called to room to assist during endoscopic procedure.  Patient ID and intended procedure confirmed with present staff. Received instructions for my participation in the procedure from the performing physician.  

## 2019-10-19 NOTE — Progress Notes (Signed)
Temperature taken by L.C., VS taken by D.T. 

## 2019-10-23 ENCOUNTER — Telehealth: Payer: Self-pay | Admitting: *Deleted

## 2019-10-23 NOTE — Telephone Encounter (Signed)
  Follow up Call-  Call back number 10/19/2019  Post procedure Call Back phone  # [phone]  Permission to leave phone message Yes  Some recent data might be hidden     Patient questions:  Do you have a fever, pain , or abdominal swelling? No. Pain Score  0 *  Have you tolerated food without any problems? yes  Have you been able to return to your normal activities? Yes.    Do you have any questions about your discharge instructions: Diet   No. Medications  No. Follow up visit  No.  Do you have questions or concerns about your Care? No.  Actions: * If pain score is 4 or above: 1. No action needed, pain <4.Have you developed a fever since your procedure? no  2.   Have you had an respiratory symptoms (SOB or cough) since your procedure? no  3.   Have you tested positive for COVID 19 since your procedure no  4.   Have you had any family members/close contacts diagnosed with the COVID 19 since your procedure?  no   If yes to any of these questions please route to Joylene John, RN and Alphonsa Gin, Therapist, sports.

## 2019-10-29 ENCOUNTER — Encounter: Payer: Self-pay | Admitting: Gastroenterology

## 2019-10-31 ENCOUNTER — Ambulatory Visit: Payer: Medicaid Other | Attending: Internal Medicine

## 2019-10-31 DIAGNOSIS — Z23 Encounter for immunization: Secondary | ICD-10-CM

## 2019-10-31 NOTE — Progress Notes (Signed)
   Covid-19 Vaccination Clinic  Name:  Jean Daugherty    MRN: [id number] DOB: 06/12/1961  10/31/2019  Jean Daugherty was observed post Covid-19 immunization for 15 minutes without incident. She was provided with Vaccine Information Sheet and instruction to access the V-Safe system.   Jean Daugherty was instructed to call 911 with any severe reactions post vaccine: Marland Kitchen Difficulty breathing  . Swelling of face and throat  . A fast heartbeat  . A bad rash all over body  . Dizziness and weakness   Immunizations Administered    Name Date Dose VIS Date Route   Moderna COVID-19 Vaccine 10/31/2019 12:20 PM 0.5 mL 07/04/2019 Intramuscular   Manufacturer: Moderna   LotFP:[id number]   [place][id number]

## 2020-03-29 ENCOUNTER — Other Ambulatory Visit: Payer: Medicaid Other

## 2020-03-29 ENCOUNTER — Other Ambulatory Visit: Payer: Self-pay | Admitting: Sleep Medicine

## 2020-03-29 DIAGNOSIS — I471 Supraventricular tachycardia: Secondary | ICD-10-CM

## 2020-03-31 LAB — NOVEL CORONAVIRUS, NAA: SARS-CoV-2, NAA: NOT DETECTED

## 2020-03-31 LAB — SARS-COV-2, NAA 2 DAY TAT

## 2020-06-18 ENCOUNTER — Other Ambulatory Visit: Payer: Self-pay | Admitting: Physician Assistant

## 2020-07-02 ENCOUNTER — Other Ambulatory Visit: Payer: Self-pay | Admitting: Physician Assistant

## 2020-07-02 DIAGNOSIS — F17200 Nicotine dependence, unspecified, uncomplicated: Secondary | ICD-10-CM

## 2020-07-19 ENCOUNTER — Ambulatory Visit
Admission: RE | Admit: 2020-07-19 | Discharge: 2020-07-19 | Disposition: A | Discharge status: Home / Self Care | Payer: Medicaid Other | Source: Ambulatory Visit | Attending: Physician Assistant | Admitting: Physician Assistant

## 2020-07-19 DIAGNOSIS — F17200 Nicotine dependence, unspecified, uncomplicated: Secondary | ICD-10-CM

## 2022-03-30 IMAGING — CT CT CHEST LUNG CANCER SCREENING LOW DOSE W/O CM
2 of 5 series · 15 of 40 positions shown, 18 images · non-contrast
Comparison: No priors.

CLINICAL DATA: 59-year-old female current smoker with 30 pack-year
history of smoking. Lung cancer screening examination.

EXAM:
CT CHEST WITHOUT CONTRAST LOW-DOSE FOR LUNG CANCER SCREENING
TECHNIQUE: Multidetector CT imaging of the chest was performed following the
standard protocol without IV contrast.

[Series 4: lung 1.00 br44 cor · coronal · 0.63mm/px · 3 of 274 slices shown]
[im 55/274  lung]
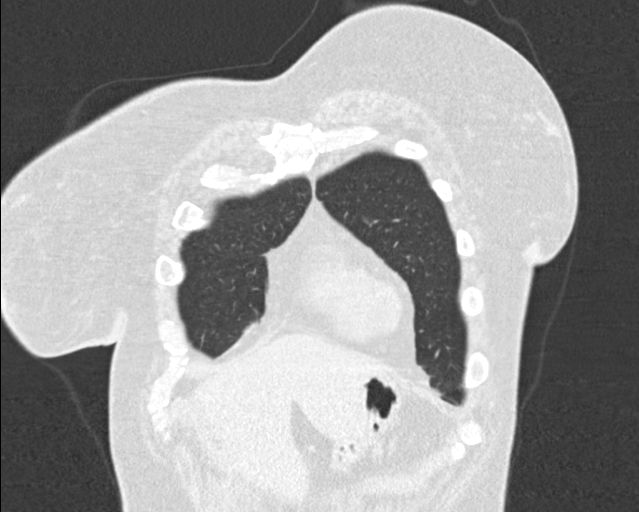
[im 110/274  lung]
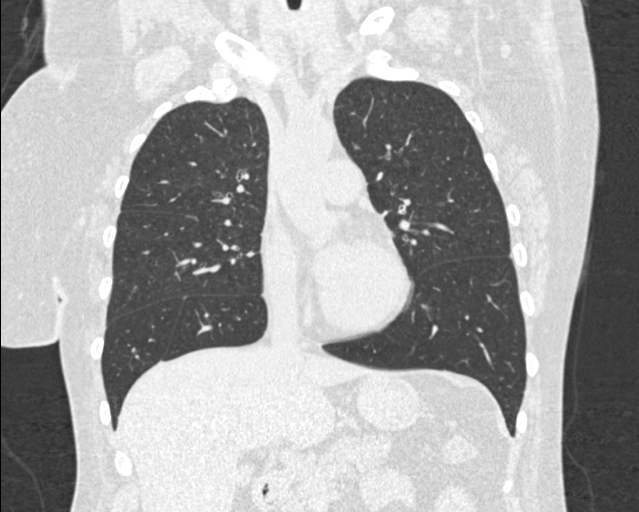
[im 164/274  lung]
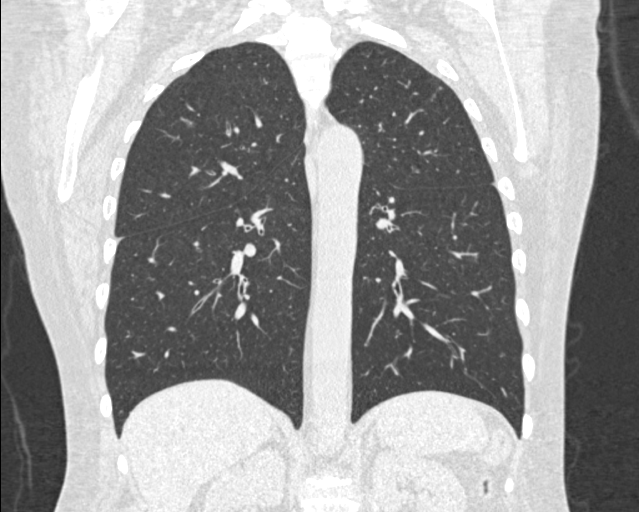

[Series 9: lung 1.00 br60 axial · axial · 0.69mm/px · z∈[-957,-665]mm · 12 of 322 slices shown, 15 images]
[im 15/322  mediastinal]
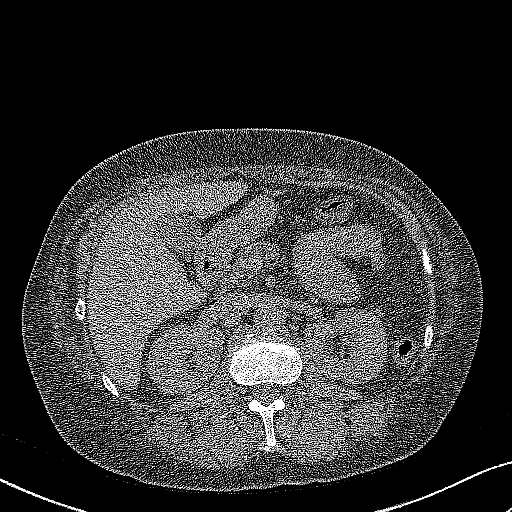
[im 15/322  lung]
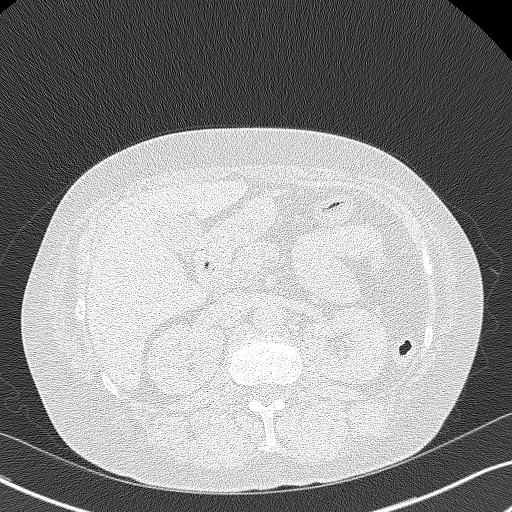
[im 44/322  lung]
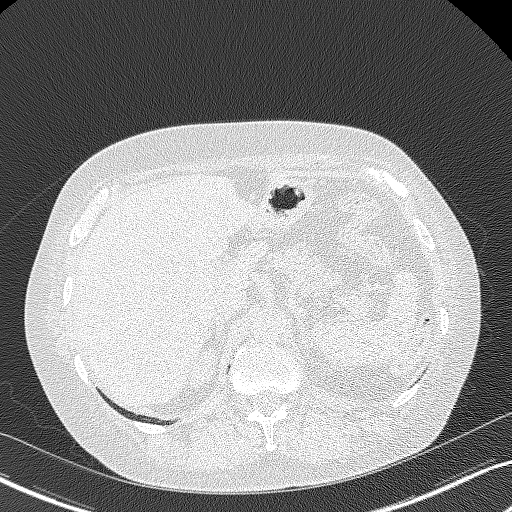
[im 73/322  lung]
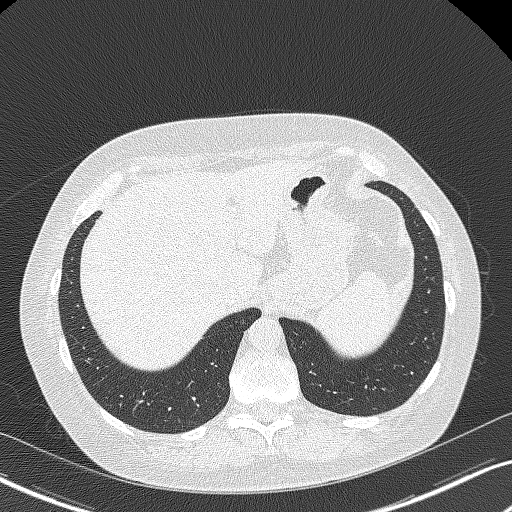
[im 103/322  lung]
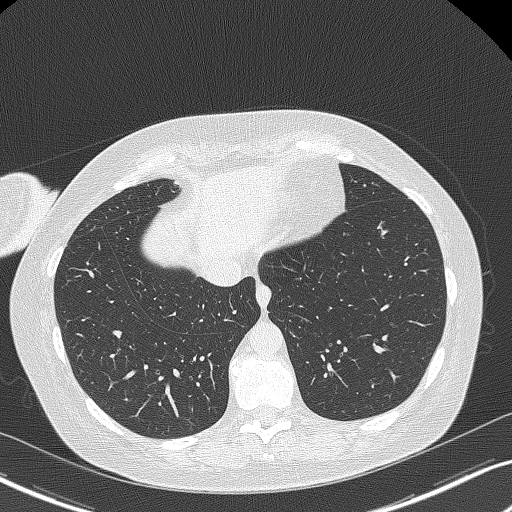
[im 117/322  mediastinal]
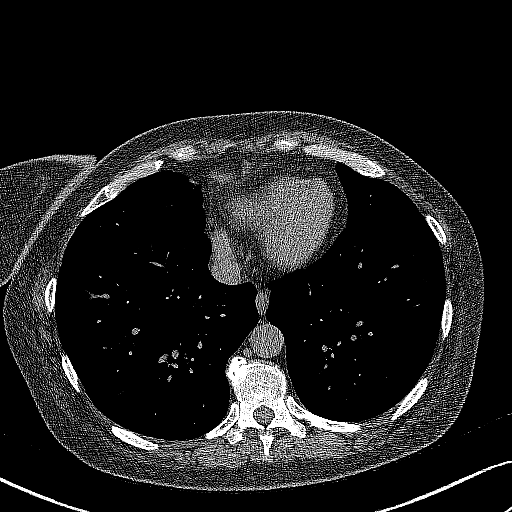
[im 117/322  lung]
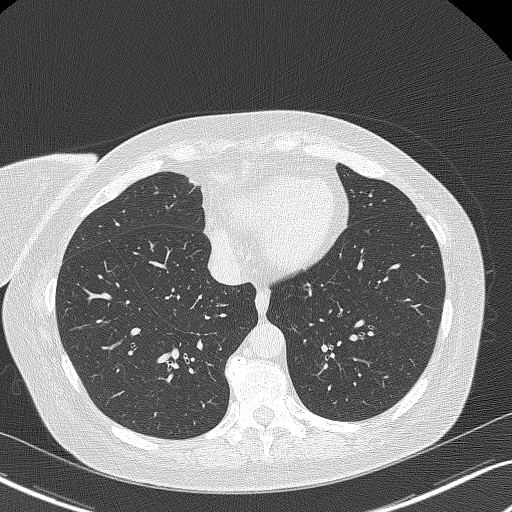
[im 146/322  lung]
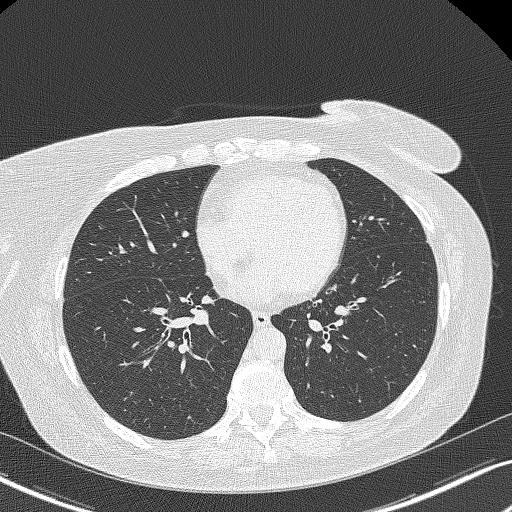
[im 176/322  lung]
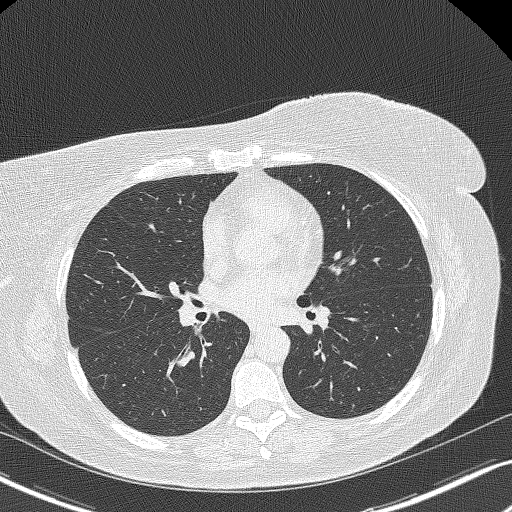
[im 205/322  lung]
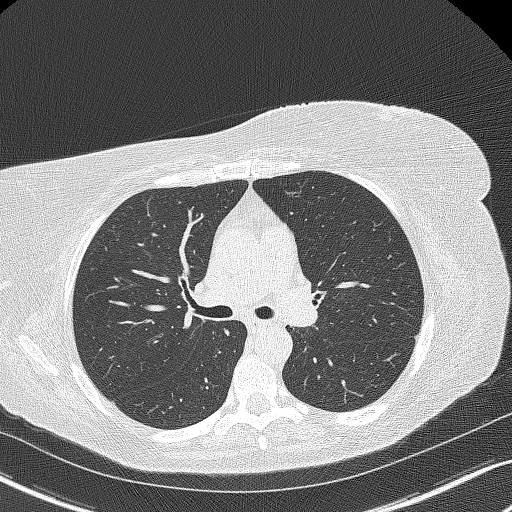
[im 219/322  mediastinal]
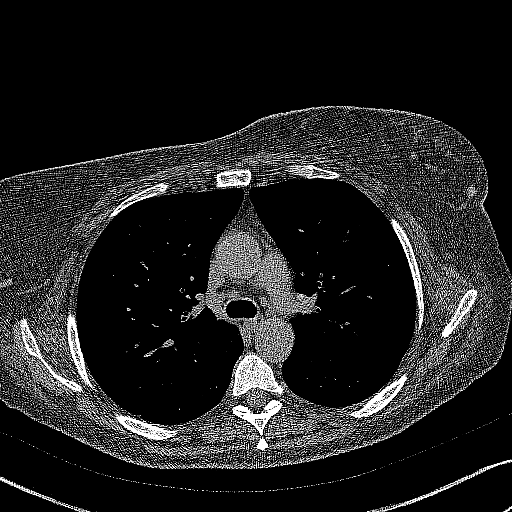
[im 219/322  lung]
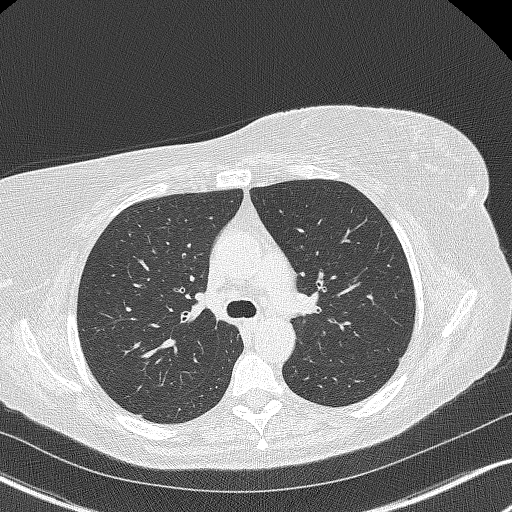
[im 249/322  lung]
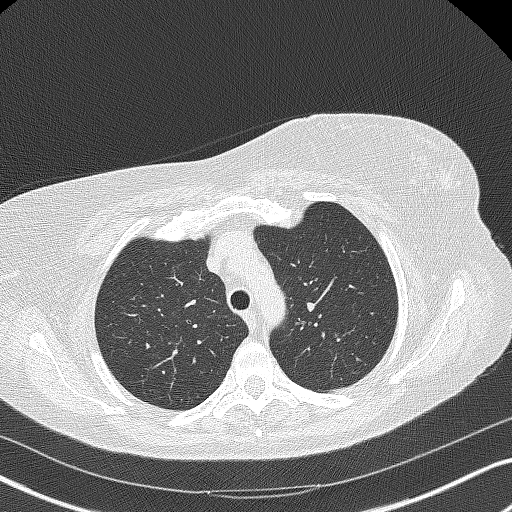
[im 278/322  lung]
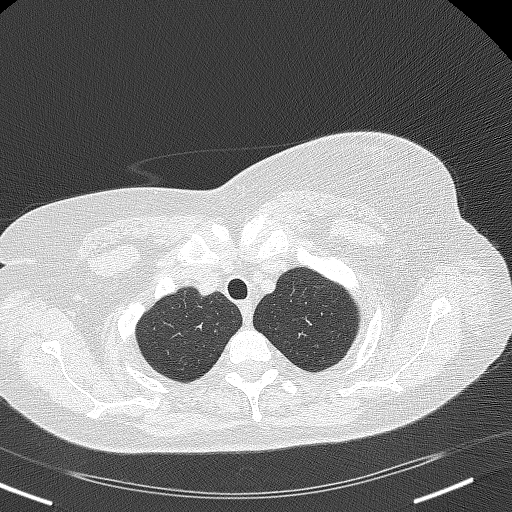
[im 307/322  lung]
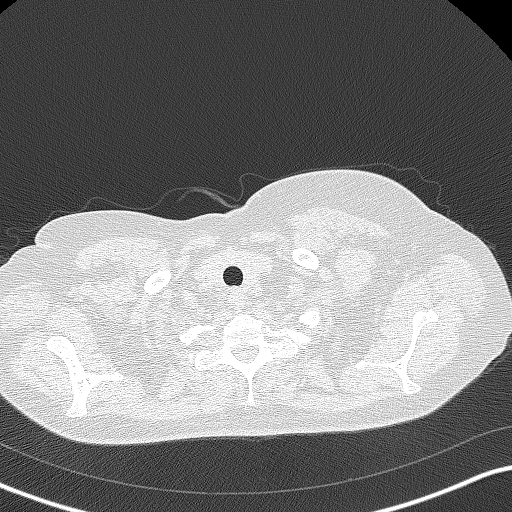

[15 of 40 positions shown; findings below may reference images not displayed]

FINDINGS: Cardiovascular: Heart size is normal. There is no significant
pericardial fluid, thickening or pericardial calcification. There is
aortic atherosclerosis, as well as atherosclerosis of the great
vessels of the mediastinum and the coronary arteries, including
calcified atherosclerotic plaque in the left anterior descending
coronary artery.

Mediastinum/Nodes: No pathologically enlarged mediastinal or hilar
lymph nodes. Please note that accurate exclusion of hilar adenopathy
is limited on noncontrast CT scans. Esophagus is unremarkable in
appearance. No axillary lymphadenopathy.

Lungs/Pleura: No suspicious appearing pulmonary nodules or masses
are noted. No acute consolidative airspace disease. No pleural
effusions. Mild diffuse bronchial wall thickening with very mild
centrilobular and paraseptal emphysema.

Upper Abdomen: Unremarkable.

Musculoskeletal: There are no aggressive appearing lytic or blastic
lesions noted in the visualized portions of the skeleton.
IMPRESSION: 1. Lung-RADS 1S, negative. Continue annual screening with low-dose
chest CT without contrast in 12 months.
2. The "S" modifier above refers to potentially clinically
significant non lung cancer related findings. Specifically, there is
aortic atherosclerosis, in addition to left anterior descending
coronary artery disease. Please note that although the presence of
coronary artery calcium documents the presence of coronary artery
disease, the severity of this disease and any potential stenosis
cannot be assessed on this non-gated CT examination. Assessment for
potential risk factor modification, dietary therapy or pharmacologic
therapy may be warranted, if clinically indicated.
3. Mild diffuse bronchial wall thickening with very mild
centrilobular and paraseptal emphysema; imaging findings suggestive
of underlying COPD.

Aortic Atherosclerosis (GH66W-NMB.B) and Emphysema (GH66W-G78.V).

## 2022-07-07 DIAGNOSIS — J441 Chronic obstructive pulmonary disease with (acute) exacerbation: Secondary | ICD-10-CM | POA: Diagnosis not present

## 2022-09-22 ENCOUNTER — Encounter: Payer: Self-pay | Admitting: Gastroenterology

## 2022-09-29 DIAGNOSIS — F330 Major depressive disorder, recurrent, mild: Secondary | ICD-10-CM | POA: Diagnosis not present

## 2022-09-29 DIAGNOSIS — I10 Essential (primary) hypertension: Secondary | ICD-10-CM | POA: Diagnosis not present

## 2022-09-29 DIAGNOSIS — J310 Chronic rhinitis: Secondary | ICD-10-CM | POA: Diagnosis not present

## 2022-09-29 DIAGNOSIS — E039 Hypothyroidism, unspecified: Secondary | ICD-10-CM | POA: Diagnosis not present

## 2022-09-29 DIAGNOSIS — F17210 Nicotine dependence, cigarettes, uncomplicated: Secondary | ICD-10-CM | POA: Diagnosis not present

## 2022-09-29 DIAGNOSIS — Z0000 Encounter for general adult medical examination without abnormal findings: Secondary | ICD-10-CM | POA: Diagnosis not present

## 2022-09-29 DIAGNOSIS — Z124 Encounter for screening for malignant neoplasm of cervix: Secondary | ICD-10-CM | POA: Diagnosis not present

## 2022-09-29 DIAGNOSIS — Z23 Encounter for immunization: Secondary | ICD-10-CM | POA: Diagnosis not present

## 2022-09-29 DIAGNOSIS — E782 Mixed hyperlipidemia: Secondary | ICD-10-CM | POA: Diagnosis not present

## 2022-09-29 DIAGNOSIS — Z1389 Encounter for screening for other disorder: Secondary | ICD-10-CM | POA: Diagnosis not present

## 2022-09-29 DIAGNOSIS — E538 Deficiency of other specified B group vitamins: Secondary | ICD-10-CM | POA: Diagnosis not present

## 2022-09-29 DIAGNOSIS — E559 Vitamin D deficiency, unspecified: Secondary | ICD-10-CM | POA: Diagnosis not present

## 2022-09-29 DIAGNOSIS — J431 Panlobular emphysema: Secondary | ICD-10-CM | POA: Diagnosis not present

## 2022-11-17 DIAGNOSIS — F331 Major depressive disorder, recurrent, moderate: Secondary | ICD-10-CM | POA: Diagnosis not present

## 2023-08-16 ENCOUNTER — Ambulatory Visit (INDEPENDENT_AMBULATORY_CARE_PROVIDER_SITE_OTHER): Payer: Medicaid Other

## 2023-08-16 ENCOUNTER — Encounter (HOSPITAL_COMMUNITY): Payer: Self-pay

## 2023-08-16 ENCOUNTER — Ambulatory Visit (HOSPITAL_COMMUNITY)
Admission: EM | Admit: 2023-08-16 | Discharge: 2023-08-16 | Disposition: A | Discharge status: Home / Self Care | Payer: Medicaid Other | Attending: Internal Medicine | Admitting: Internal Medicine

## 2023-08-16 DIAGNOSIS — M778 Other enthesopathies, not elsewhere classified: Secondary | ICD-10-CM | POA: Diagnosis not present

## 2023-08-16 DIAGNOSIS — W010XXA Fall on same level from slipping, tripping and stumbling without subsequent striking against object, initial encounter: Secondary | ICD-10-CM | POA: Insufficient documentation

## 2023-08-16 DIAGNOSIS — M25512 Pain in left shoulder: Secondary | ICD-10-CM

## 2023-08-16 DIAGNOSIS — W19XXXA Unspecified fall, initial encounter: Secondary | ICD-10-CM

## 2023-08-16 DIAGNOSIS — J029 Acute pharyngitis, unspecified: Secondary | ICD-10-CM | POA: Diagnosis not present

## 2023-08-16 MED ORDER — KETOROLAC TROMETHAMINE 30 MG/ML IJ SOLN
30.0000 mg | Freq: Once | INTRAMUSCULAR | Status: AC
  Administered 2023-08-16: 30 mg via INTRAMUSCULAR

## 2023-08-16 MED ORDER — OXYCODONE HCL 5 MG PO TABS: 5.0000 mg | ORAL_TABLET | Freq: Four times a day (QID) | ORAL | 0 refills | Status: DC | PRN

## 2023-08-16 MED ORDER — KETOROLAC TROMETHAMINE 30 MG/ML IJ SOLN
INTRAMUSCULAR | Status: AC
  Filled 2023-08-16: qty 1

## 2023-08-16 NOTE — Discharge Instructions (Addendum)
 Possible left shoulder separation. Final read on the x-ray is pending but if there are any changes we will contact you. This needs to follow up with an orthopedist.   Toradol  injection given today. This is a medication to help with pain. This is not a narcotic.  Oxycodone  5 mg every 6 hours as needed for severe pain. Do not drive while taking this medication. Use this for severe pain. May use tylenol  or ibuprofen for mild to moderate pain Keep left arm in a sling day and night but may remove to take showers. Avoid doing any heavy activity with your left arm until follow up with orthopedics.  Call orthopedist tomorrow to schedule an appointment Use ice on the area 2-3 times daily for 10 minutes but don't put the ice directly on the skin.  Return to urgent care or PCP if symptoms worsen or fail to resolve.

## 2023-08-16 NOTE — ED Provider Notes (Signed)
 [facility]    CSN: [id number] Arrival date & time: 08/16/23  1549      History   Chief Complaint Chief Complaint  Patient presents with   Fall    HPI Jean Daugherty  is a 63 y.o. female.   63 year old female who presents to urgent care with complaints of left shoulder pain and decreased range of motion after a fall yesterday.  She reports she tripped on her rug in the house and fell backwards onto her shoulder and buttocks.  She has had significant difficulty moving her left shoulder since then.  She denies any history of shoulder injury in the past.  She also is requesting to be tested for COVID as she has had a sore throat recently and has been around people recently who have been ill and she is concerned she may have COVID.  She denies fevers or chills but does relate she has hot flashes.  She denies headache, congestion, shortness of breath, chest pain.   Fall Pertinent negatives include no chest pain, no abdominal pain and no shortness of breath.    Past Medical History:  Diagnosis Date   Arthritis    Cardiac arrhythmia    Hypertension     There are no active problems to display for this patient.   Past Surgical History:  Procedure Laterality Date   MOLE REMOVAL Right    elbow    OB History   No obstetric history on file.      Home Medications    Prior to Admission medications   Medication Sig Start Date End Date Taking? Authorizing Provider  albuterol  (VENTOLIN  HFA) 108 (90 Base) MCG/ACT inhaler Inhale into the lungs. 09/29/22  Yes [provider]  bisoprolol-hydrochlorothiazide (ZIAC) 10-6.25 MG tablet Take 1 tablet by mouth daily. 08/04/23  Yes [provider]  Cholecalciferol 1.25 MG (50000 UT) capsule Take by mouth. 10/14/22  Yes [provider]  fluticasone (FLONASE) 50 MCG/ACT nasal spray Use 1 squirt to each nostril daily as needed for sinus symptoms 09/29/22  Yes [provider]  dicyclomine  (BENTYL ) 10  MG capsule Take 1 capsule (10 mg total) by mouth 3 (three) times daily before meals. 09/01/19   Aneita Gwendlyn DASEN, MD  diphenhydramine -acetaminophen  (TYLENOL  PM) 25-500 MG TABS tablet Take 1 tablet by mouth at bedtime as needed (sleep).    [provider]  levothyroxine (SYNTHROID) 75 MCG tablet Take by mouth. Patient not taking: Reported on 08/16/2023 09/29/22   [provider]  metroNIDAZOLE  (FLAGYL ) 500 MG tablet Take 1 tablet (500 mg total) by mouth 2 (two) times daily. 09/01/19   Aneita Gwendlyn DASEN, MD    Family History Family History  Problem Relation Age of Onset   Diabetes Mother    Kidney disease Mother    Diabetes Father    Heart disease Father    Kidney failure Brother    Heart failure Brother    Hypertension Brother    Heart disease Brother    Sleep apnea Brother    Hypertension Brother    Sleep apnea Brother    Hypertension Brother    Hypertension Brother    Hypertension Brother    Hypertension Brother    Colon cancer Neg Hx    Esophageal cancer Neg Hx    Rectal cancer Neg Hx    Stomach cancer Neg Hx     Social History Social History   Tobacco Use   Smoking status: Every Day    Current packs/day: 0.50  Types: Cigarettes   Smokeless tobacco: Never  Vaping Use   Vaping status: Never Used  Substance Use Topics   Alcohol use: Yes    Alcohol/week: 2.0 standard drinks of alcohol    Types: 2 Cans of beer per week    Comment: 2 per week   Drug use: No     Allergies   Patient has no known allergies.   Review of Systems Review of Systems  Constitutional:  Negative for chills and fever.  HENT:  Positive for sore throat. Negative for ear pain.   Eyes:  Negative for pain and visual disturbance.  Respiratory:  Negative for cough and shortness of breath.   Cardiovascular:  Negative for chest pain and palpitations.  Gastrointestinal:  Negative for abdominal pain and vomiting.  Genitourinary:  Negative for dysuria and hematuria.   Musculoskeletal:  Positive for joint swelling. Negative for arthralgias and back pain.       Left shoulder pain  Skin:  Negative for color change and rash.  Neurological:  Negative for seizures and syncope.  All other systems reviewed and are negative.    Physical Exam Triage Vital Signs ED Triage Vitals [08/16/23 1723]  Encounter Vitals Group     BP (!) 160/95     Systolic BP Percentile      Diastolic BP Percentile      Pulse Rate 70     Resp 18     Temp 97.8 F (36.6 C)     Temp Source Oral     SpO2 95 %     Weight      Height      Head Circumference      Peak Flow      Pain Score      Pain Loc      Pain Education      Exclude from Growth Chart    No data found.  Updated Vital Signs BP (!) 160/95 (BP Location: Left Arm)   Pulse 70   Temp 97.8 F (36.6 C) (Oral)   Resp 18   SpO2 95%   Visual Acuity Right Eye Distance:   Left Eye Distance:   Bilateral Distance:    Right Eye Near:   Left Eye Near:    Bilateral Near:     Physical Exam Vitals and nursing note reviewed.  Constitutional:      General: She is not in acute distress.    Appearance: She is well-developed.  HENT:     Head: Normocephalic and atraumatic.  Eyes:     Conjunctiva/sclera: Conjunctivae normal.  Cardiovascular:     Rate and Rhythm: Normal rate and regular rhythm.     Heart sounds: No murmur heard. Pulmonary:     Effort: Pulmonary effort is normal. No respiratory distress.     Breath sounds: Normal breath sounds.  Abdominal:     Palpations: Abdomen is soft.     Tenderness: There is no abdominal tenderness.  Musculoskeletal:        General: No swelling.     Left shoulder: Swelling, tenderness and bony tenderness present. Decreased range of motion. Decreased strength. Normal pulse.     Cervical back: Neck supple.  Skin:    General: Skin is warm and dry.     Capillary Refill: Capillary refill takes less than 2 seconds.  Neurological:     Mental Status: She is alert.   Psychiatric:        Mood and Affect: Mood normal.  UC Treatments / Results  Labs (all labs ordered are listed, but only abnormal results are displayed) Labs Reviewed - No data to display  EKG   Radiology No results found.  Procedures Procedures (including critical care time)  Medications Ordered in UC Medications - No data to display  Initial Impression / Assessment and Plan / UC Course  I have reviewed the triage vital signs and the nursing notes.  Pertinent labs & imaging results that were available during my care of the patient were reviewed by me and considered in my medical decision making (see chart for details).     Acute pain of left shoulder - Plan: DG Shoulder Left, DG Clavicle Left, DG Shoulder Left, DG Clavicle Left  Fall, initial encounter - Plan: DG Shoulder Left, DG Clavicle Left, DG Shoulder Left, DG Clavicle Left  Sore throat   Possible left shoulder separation. Final read on the x-ray is pending but if there are any changes we will contact you. This needs to follow up with an orthopedist.   Toradol  injection given today. This is a medication to help with pain. This is not a narcotic.  Oxycodone  5 mg every 6 hours as needed for severe pain. Do not drive while taking this medication. Use this for severe pain. May use tylenol  or ibuprofen for mild to moderate pain Keep left arm in a sling day and night but may remove to take showers. Avoid doing any heavy activity with your left arm until follow up with orthopedics.  Call orthopedist tomorrow to schedule an appointment Use ice on the area 2-3 times daily for 10 minutes but don't put the ice directly on the skin.  Return to urgent care or PCP if symptoms worsen or fail to resolve.    Final Clinical Impressions(s) / UC Diagnoses   Final diagnoses:  Acute pain of left shoulder  Fall, initial encounter   Discharge Instructions   None    ED Prescriptions   None    PDMP not reviewed this  encounter.   Teresa Almarie LABOR, [place] 08/16/23 1907

## 2023-08-16 NOTE — ED Triage Notes (Signed)
 Pt states tripped and fell around 10:30pm last night hit her buttocks and lt shoulder on her carpet floor. Took tylenol today with no relief.

## 2023-08-17 LAB — SARS CORONAVIRUS 2 (TAT 6-24 HRS): SARS Coronavirus 2: NEGATIVE

## 2023-09-09 ENCOUNTER — Ambulatory Visit (HOSPITAL_COMMUNITY)
Admission: EM | Admit: 2023-09-09 | Discharge: 2023-09-09 | Disposition: A | Discharge status: Home / Self Care | Payer: Medicaid Other | Attending: Physician Assistant | Admitting: Physician Assistant

## 2023-09-09 ENCOUNTER — Encounter (HOSPITAL_COMMUNITY): Payer: Self-pay

## 2023-09-09 DIAGNOSIS — R062 Wheezing: Secondary | ICD-10-CM | POA: Diagnosis not present

## 2023-09-09 DIAGNOSIS — J449 Chronic obstructive pulmonary disease, unspecified: Secondary | ICD-10-CM

## 2023-09-09 DIAGNOSIS — R42 Dizziness and giddiness: Secondary | ICD-10-CM

## 2023-09-09 MED ORDER — MECLIZINE HCL 12.5 MG PO TABS: 12.5000 mg | ORAL_TABLET | Freq: Three times a day (TID) | ORAL | 0 refills | Status: DC | PRN

## 2023-09-09 MED ORDER — ALBUTEROL SULFATE HFA 108 (90 BASE) MCG/ACT IN AERS: 2.0000 | INHALATION_SPRAY | RESPIRATORY_TRACT | 1 refills | Status: AC | PRN

## 2023-09-09 NOTE — ED Provider Notes (Signed)
 [facility]   MRN: [id number] DOB: 1961/07/05  Subjective:   Jean Daugherty  is a 63 y.o. female presenting for intermittent dizziness x 4 days.  Patient reports that 4 days ago she woke up in the morning and felt like she was spinning.  This spinning/off-balance sensation has been happening intermittently over the last 4 days with certain movements and changing positions.  She denies any headache.  She denies any change in vision.  No feelings of passing out.  No chest pain or shortness of breath.  She has had some increased nasal drainage, but no other recent sickness.  She has been told that she has COPD and she had to use her inhaler last week.  She is out of this rescue inhaler and needs a refill.  She does not feel short of breath, nor is she coughing or having any chest tightness.   No current facility-administered medications for this encounter.  Current Outpatient Medications:    meclizine  (ANTIVERT ) 12.5 MG tablet, Take 1 tablet (12.5 mg total) by mouth 3 (three) times daily as needed for dizziness., Disp: 30 tablet, Rfl: 0   albuterol  (VENTOLIN  HFA) 108 (90 Base) MCG/ACT inhaler, Inhale 2 puffs into the lungs every 4 (four) hours as needed for wheezing or shortness of breath., Disp: 18 g, Rfl: 1   bisoprolol-hydrochlorothiazide (ZIAC) 10-6.25 MG tablet, Take 1 tablet by mouth daily., Disp: , Rfl:    Cholecalciferol 1.25 MG (50000 UT) capsule, Take by mouth., Disp: , Rfl:    dicyclomine  (BENTYL ) 10 MG capsule, Take 1 capsule (10 mg total) by mouth 3 (three) times daily before meals., Disp: 90 capsule, Rfl: 11   diphenhydramine -acetaminophen  (TYLENOL  PM) 25-500 MG TABS tablet, Take 1 tablet by mouth at bedtime as needed (sleep)., Disp: , Rfl:    fluticasone (FLONASE) 50 MCG/ACT nasal spray, Use 1 squirt to each nostril daily as needed for sinus symptoms, Disp: , Rfl:    levothyroxine (SYNTHROID) 75 MCG tablet, Take by mouth. (Patient not taking: Reported on  08/16/2023), Disp: , Rfl:    metroNIDAZOLE  (FLAGYL ) 500 MG tablet, Take 1 tablet (500 mg total) by mouth 2 (two) times daily., Disp: 14 tablet, Rfl: 0   oxyCODONE  (ROXICODONE ) 5 MG immediate release tablet, Take 1 tablet (5 mg total) by mouth every 6 (six) hours as needed for severe pain (pain score 7-10)., Disp: 15 tablet, Rfl: 0   No Known Allergies  Past Medical History:  Diagnosis Date   Arthritis    Cardiac arrhythmia    Hypertension      Past Surgical History:  Procedure Laterality Date   MOLE REMOVAL Right    elbow    Family History  Problem Relation Age of Onset   Diabetes Mother    Kidney disease Mother    Diabetes Father    Heart disease Father    Kidney failure Brother    Heart failure Brother    Hypertension Brother    Heart disease Brother    Sleep apnea Brother    Hypertension Brother    Sleep apnea Brother    Hypertension Brother    Hypertension Brother    Hypertension Brother    Hypertension Brother    Colon cancer Neg Hx    Esophageal cancer Neg Hx    Rectal cancer Neg Hx    Stomach cancer Neg Hx     Social History   Tobacco Use   Smoking status: Every Day    Current packs/day: 0.50  Types: Cigarettes   Smokeless tobacco: Never  Vaping Use   Vaping status: Never Used  Substance Use Topics   Alcohol use: Yes    Alcohol/week: 2.0 standard drinks of alcohol    Types: 2 Cans of beer per week    Comment: 2 per week   Drug use: No    ROS REFER TO HPI FOR PERTINENT POSITIVES AND NEGATIVES   Objective:   Vitals: BP (!) 144/98 (BP Location: Right Arm)   Pulse 68   Temp 98.4 F (36.9 C) (Oral)   Resp 17   SpO2 98%   Physical Exam Vitals and nursing note reviewed.  Constitutional:      General: She is not in acute distress.    Appearance: Normal appearance. She is not ill-appearing.  HENT:     Head: Normocephalic.     Right Ear: Tympanic membrane, ear canal and external ear normal.     Left Ear: Tympanic membrane, ear canal and  external ear normal.     Nose: No congestion.     Mouth/Throat:     Mouth: Mucous membranes are moist.     Pharynx: No oropharyngeal exudate or posterior oropharyngeal erythema.  Eyes:     Extraocular Movements: Extraocular movements intact.     Conjunctiva/sclera: Conjunctivae normal.     Pupils: Pupils are equal, round, and reactive to light.  Cardiovascular:     Rate and Rhythm: Normal rate and regular rhythm.     Pulses: Normal pulses.     Heart sounds: Normal heart sounds. No murmur heard. Pulmonary:     Effort: Pulmonary effort is normal. No respiratory distress.     Breath sounds: Wheezing (diffuse) present. No rhonchi.  Musculoskeletal:     Cervical back: Normal range of motion.     Right lower leg: No edema.     Left lower leg: No edema.  Skin:    General: Skin is warm.     Findings: No lesion or rash.  Neurological:     General: No focal deficit present.     Mental Status: She is alert and oriented to person, place, and time.     Cranial Nerves: No cranial nerve deficit.     Sensory: No sensory deficit.     Motor: No weakness.     Coordination: Coordination normal.     Gait: Gait normal.     Comments: +Dix-hall-pike on the RIGHT   Psychiatric:        Mood and Affect: Mood normal.        Behavior: Behavior normal.     No results found for this or any previous visit (from the past 24 hours).  Assessment and Plan :   PDMP not reviewed this encounter.  1. Dizziness   2. Vertigo   3. Wheezing   4. Chronic obstructive pulmonary disease, unspecified COPD type (HCC)    No focal neurodeficits noted.  Exam reassuring.  She has a positive Dix-Hallpike on the right.  Her symptoms are consistent with benign positional vertigo.  Handout provided on Epley maneuver to do at home.  Also gave a prescription for meclizine  to take as directed.  She needs to stay well-hydrated and push fluids.  Strict ER precautions.  Patient agreeable with plan.  She is going to follow-up with  her PCP next week.  Also noted to be wheezing, she has a history of COPD.  Refilled her rescue inhaler to use as directed.  She will follow-up with her PCP.  AllwardtMardy HERO, PA-C 09/09/23 2002

## 2023-09-09 NOTE — ED Triage Notes (Addendum)
 Pt presents with c/o dizziness x 4 days. Pt denies N/V/D and chest pain. Pt is not dizzy at the moment.

## 2023-09-09 NOTE — Discharge Instructions (Signed)
 Good to meet you today.  I do believe that your symptoms are consistent with vertigo.  Please refer to the handout with a maneuver you can do at home to help with vertigo symptoms 3 times a day.  You may also take meclizine  up to 3 times a day to help with the dizziness.  Caution of some drowsiness and dry mouth.  You need to stay well-hydrated, pushing fluids.  You need to follow-up with your primary care as scheduled next week.  I have also refilled your rescue inhaler to use 2 puffs every 4-6 hours as needed for wheezing or shortness of breath.  Should you suddenly have the worst headache of your life, severe dizziness or fall, weakness on one side of the body, slurred speech, or any other sudden changes, you need to present to the emergency department immediately.

## 2023-09-22 DIAGNOSIS — M25512 Pain in left shoulder: Secondary | ICD-10-CM | POA: Diagnosis not present

## 2023-09-22 DIAGNOSIS — R42 Dizziness and giddiness: Secondary | ICD-10-CM | POA: Diagnosis not present

## 2023-09-22 DIAGNOSIS — F331 Major depressive disorder, recurrent, moderate: Secondary | ICD-10-CM | POA: Diagnosis not present

## 2023-09-22 DIAGNOSIS — M545 Low back pain, unspecified: Secondary | ICD-10-CM | POA: Diagnosis not present

## 2023-09-22 DIAGNOSIS — Z741 Need for assistance with personal care: Secondary | ICD-10-CM | POA: Diagnosis not present

## 2023-09-22 DIAGNOSIS — Z23 Encounter for immunization: Secondary | ICD-10-CM | POA: Diagnosis not present

## 2023-09-22 DIAGNOSIS — I10 Essential (primary) hypertension: Secondary | ICD-10-CM | POA: Diagnosis not present

## 2023-09-22 DIAGNOSIS — Z599 Problem related to housing and economic circumstances, unspecified: Secondary | ICD-10-CM | POA: Diagnosis not present

## 2023-09-22 DIAGNOSIS — M25572 Pain in left ankle and joints of left foot: Secondary | ICD-10-CM | POA: Diagnosis not present

## 2023-09-22 DIAGNOSIS — M25571 Pain in right ankle and joints of right foot: Secondary | ICD-10-CM | POA: Diagnosis not present

## 2023-09-22 DIAGNOSIS — E782 Mixed hyperlipidemia: Secondary | ICD-10-CM | POA: Diagnosis not present

## 2023-09-22 DIAGNOSIS — E039 Hypothyroidism, unspecified: Secondary | ICD-10-CM | POA: Diagnosis not present

## 2024-02-17 ENCOUNTER — Telehealth (INDEPENDENT_AMBULATORY_CARE_PROVIDER_SITE_OTHER): Payer: Self-pay

## 2024-02-17 NOTE — Telephone Encounter (Signed)
 Called pt to confirm appt 7/18 LVM

## 2024-02-18 ENCOUNTER — Ambulatory Visit (INDEPENDENT_AMBULATORY_CARE_PROVIDER_SITE_OTHER): Admitting: Primary Care

## 2024-03-30 ENCOUNTER — Ambulatory Visit (HOSPITAL_COMMUNITY)
Admission: EM | Admit: 2024-03-30 | Discharge: 2024-03-30 | Disposition: A | Discharge status: Home / Self Care | Attending: Nurse Practitioner | Admitting: Nurse Practitioner

## 2024-03-30 ENCOUNTER — Encounter (HOSPITAL_COMMUNITY): Payer: Self-pay

## 2024-03-30 DIAGNOSIS — L500 Allergic urticaria: Secondary | ICD-10-CM | POA: Diagnosis not present

## 2024-03-30 HISTORY — DX: Dizziness and giddiness: R42

## 2024-03-30 MED ORDER — EPINEPHRINE 0.3 MG/0.3ML IJ SOAJ: 0.3000 mg | INTRAMUSCULAR | 0 refills | Status: AC | PRN

## 2024-03-30 MED ORDER — DEXAMETHASONE SODIUM PHOSPHATE 10 MG/ML IJ SOLN
10.0000 mg | Freq: Once | INTRAMUSCULAR | Status: AC
  Administered 2024-03-30: 10 mg via INTRAMUSCULAR

## 2024-03-30 MED ORDER — HYDROXYZINE HCL 25 MG PO TABS: 25.0000 mg | ORAL_TABLET | Freq: Every day | ORAL | 0 refills | Status: AC

## 2024-03-30 MED ORDER — PREDNISONE 20 MG PO TABS: 40.0000 mg | ORAL_TABLET | Freq: Every day | ORAL | 0 refills | Status: AC

## 2024-03-30 MED ORDER — DEXAMETHASONE SODIUM PHOSPHATE 10 MG/ML IJ SOLN
INTRAMUSCULAR | Status: AC
Start: 2024-03-30 — End: 2024-03-30
  Filled 2024-03-30: qty 1

## 2024-03-30 NOTE — ED Provider Notes (Signed)
 [facility]    CSN: [id number] Arrival date & time: 03/30/24  1502      History   Chief Complaint Chief Complaint  Patient presents with   Urticaria    HPI Jean Daugherty  is a 63 y.o. female.   Discussed the use of AI scribe software for clinical note transcription with the patient, who gave verbal consent to proceed.   Patient presents with a suspected allergic reaction that started two days ago. The patient reports experiencing hives and lip swelling.  The patient states that the allergic reaction began two days ago after having dinner at [facility], where she noticed the soda tasted sweeter than usual. She developed itchy welts resembling mosquito bites on the outside of her arms and legs. The hives come and go, typically appearing around dinner time on both days. At approximately 2 AM, the patient experienced swelling of her top lip. The patient denies any throat closing, difficulty breathing, or swallowing issues. To manage her symptoms, the patient has applied alcohol and hydrocortisone to the affected areas. The patient denies any new foods, soaps, lotions, detergents, or medications. She also denies staying outside her house or being around animals recently.  The following portions of the patient's history were reviewed and updated as appropriate: allergies, current medications, past family history, past medical history, past social history, past surgical history, and problem list.    Past Medical History:  Diagnosis Date   Arthritis    Cardiac arrhythmia    Hypertension    Vertigo     There are no active problems to display for this patient.   Past Surgical History:  Procedure Laterality Date   MOLE REMOVAL Right    elbow    OB History   No obstetric history on file.      Home Medications    Prior to Admission medications   Medication Sig Start Date End Date Taking? Authorizing Provider  albuterol  (VENTOLIN  HFA) 108 (90 Base) MCG/ACT  inhaler Inhale 2 puffs into the lungs every 4 (four) hours as needed for wheezing or shortness of breath. 09/09/23  Yes Allwardt, Alyssa M, PA-C  bisoprolol-hydrochlorothiazide (ZIAC) 10-6.25 MG tablet Take 1 tablet by mouth daily. 08/04/23  Yes [provider]  Cholecalciferol 1.25 MG (50000 UT) capsule Take by mouth. 10/14/22  Yes [provider]  diphenhydramine -acetaminophen  (TYLENOL  PM) 25-500 MG TABS tablet Take 1 tablet by mouth at bedtime as needed (sleep).   Yes [provider]  EPINEPHrine  0.3 mg/0.3 mL IJ SOAJ injection Inject 0.3 mg into the muscle as needed for anaphylaxis. Call 911 or have someone drive you to the emergency department immediately after use 03/30/24  Yes Krystena Reitter, FNP  fluticasone (FLONASE) 50 MCG/ACT nasal spray Use 1 squirt to each nostril daily as needed for sinus symptoms 09/29/22  Yes [provider]  hydrOXYzine  (ATARAX ) 25 MG tablet Take 1 tablet (25 mg total) by mouth at bedtime for 10 days. 03/30/24 04/09/24 Yes Setareh Rom, FNP  predniSONE  (DELTASONE ) 20 MG tablet Take 2 tablets (40 mg total) by mouth daily for 5 days. 03/30/24 04/04/24 Yes Iola Lukes, FNP    Family History Family History  Problem Relation Age of Onset   Diabetes Mother    Kidney disease Mother    Diabetes Father    Heart disease Father    Kidney failure Brother    Heart failure Brother    Hypertension Brother    Heart disease Brother    Sleep apnea Brother  Hypertension Brother    Sleep apnea Brother    Hypertension Brother    Hypertension Brother    Hypertension Brother    Hypertension Brother    Colon cancer Neg Hx    Esophageal cancer Neg Hx    Rectal cancer Neg Hx    Stomach cancer Neg Hx     Social History Social History   Tobacco Use   Smoking status: Every Day    Current packs/day: 0.50    Types: Cigarettes   Smokeless tobacco: Never  Vaping Use   Vaping status: Never Used  Substance Use Topics   Alcohol use:  Yes    Alcohol/week: 2.0 standard drinks of alcohol    Types: 2 Cans of beer per week    Comment: 2 per week   Drug use: No     Allergies   Patient has no known allergies.   Review of Systems Review of Systems  HENT:  Positive for facial swelling (upper lip swelling). Negative for trouble swallowing.   Respiratory:  Negative for shortness of breath.   Skin:  Positive for rash.  All other systems reviewed and are negative.    Physical Exam Triage Vital Signs ED Triage Vitals  Encounter Vitals Group     BP 03/30/24 1542 130/88     Girls Systolic BP Percentile --      Girls Diastolic BP Percentile --      Boys Systolic BP Percentile --      Boys Diastolic BP Percentile --      Pulse Rate 03/30/24 1542 69     Resp 03/30/24 1542 18     Temp 03/30/24 1542 97.7 F (36.5 C)     Temp Source 03/30/24 1542 Oral     SpO2 03/30/24 1542 95 %     Weight 03/30/24 1542 160 lb (72.6 kg)     Height 03/30/24 1542 5' 6.5 (1.689 m)     Head Circumference --      Peak Flow --      Pain Score 03/30/24 1539 4     Pain Loc --      Pain Education --      Exclude from Growth Chart --    No data found.  Updated Vital Signs BP 130/88 (BP Location: Right Arm)   Pulse 69   Temp 97.7 F (36.5 C) (Oral)   Resp 18   Ht 5' 6.5 (1.689 m)   Wt 160 lb (72.6 kg)   SpO2 95%   BMI 25.44 kg/m   Visual Acuity Right Eye Distance:   Left Eye Distance:   Bilateral Distance:    Right Eye Near:   Left Eye Near:    Bilateral Near:     Physical Exam Vitals reviewed.  Constitutional:      General: She is awake. She is not in acute distress.    Appearance: Normal appearance. She is well-developed. She is not ill-appearing, toxic-appearing or diaphoretic.  HENT:     Head: Normocephalic.     Right Ear: Hearing normal.     Left Ear: Hearing normal.     Nose: Nose normal.     Mouth/Throat:     Mouth: Mucous membranes are moist.     Pharynx: Oropharynx is clear. Uvula midline.     Comments:  No mouth, lip or tongue swelling noted. Eyes:     General: Vision grossly intact.     Conjunctiva/sclera: Conjunctivae normal.  Neck:     Trachea: Phonation normal.  Cardiovascular:     Rate and Rhythm: Normal rate and regular rhythm.     Heart sounds: Normal heart sounds.  Pulmonary:     Effort: Pulmonary effort is normal.     Breath sounds: Normal breath sounds and air entry.     Comments: Respirations even and unlabored Musculoskeletal:        General: Normal range of motion.     Cervical back: Full passive range of motion without pain, normal range of motion and neck supple.     Right lower leg: No edema.     Left lower leg: No edema.  Skin:    General: Skin is warm and dry.     Findings: No rash.     Comments: No rash or other skin eruptions noted  Neurological:     General: No focal deficit present.     Mental Status: She is alert and oriented to person, place, and time.  Psychiatric:        Speech: Speech normal.        Behavior: Behavior is cooperative.      UC Treatments / Results  Labs (all labs ordered are listed, but only abnormal results are displayed) Labs Reviewed - No data to display  EKG   Radiology No results found.  Procedures Procedures (including critical care time)  Medications Ordered in UC Medications  dexamethasone  (DECADRON ) injection 10 mg (10 mg Intramuscular Given 03/30/24 1632)    Initial Impression / Assessment and Plan / UC Course  I have reviewed the triage vital signs and the nursing notes.  Pertinent labs & imaging results that were available during my care of the patient were reviewed by me and considered in my medical decision making (see chart for details).     Patient presents with intermittent itching, hives on the arms and legs, and swelling of the upper lip over the past two days, with episodes recurring around dinnertime. No difficulty breathing or swallowing has been reported. She attempted self-treatment with  alcohol and hydrocortisone with partial relief. No clear new allergens or exposures have been identified, though a trigger is suspected.   On today's visit, there was no lip or tongue swelling, oropharyngeal edema, or rash of any sort noted. IM Decadron  was administered, and hydroxyzine  was prescribed to be taken nightly for symptom control. Hydrocortisone cream may be continued as needed. An epinephrine  auto-injector was prescribed for emergency use, with instructions to administer if sudden tongue or throat swelling, difficulty swallowing, or severe symptoms occur and to immediately call 911 or go to the nearest emergency department. Supportive care with oatmeal-based, fragrance-free products, cool compresses, and cool showers was recommended. Patient was advised to avoid scratching affected areas. Follow-up with an allergy and asthma specialist was recommended to help identify the underlying trigger.  Today's evaluation has revealed no signs of a dangerous process. Discussed diagnosis with patient and/or guardian. Patient and/or guardian aware of their diagnosis, possible red flag symptoms to watch out for and need for close follow up. Patient and/or guardian understands verbal and written discharge instructions. Patient and/or guardian comfortable with plan and disposition.  Patient and/or guardian has a clear mental status at this time, good insight into illness (after discussion and teaching) and has clear judgment to make decisions regarding their care  Documentation was completed with the aid of voice recognition software. Transcription may contain typographical errors. Final Clinical Impressions(s) / UC Diagnoses   Final diagnoses:  Allergic urticaria     Discharge Instructions  You were seen today for itching, hives, and swelling of your upper lip that has been occurring intermittently over the past 2 days.  During your evaluation today, there was no notable hives or lip swelling.   However, your reported symptoms are most consistent with an allergic reaction. A steroid injection was given to help calm the reaction, and you were prescribed hydroxyzine  to take at night for itching. You may continue using hydrocortisone cream on the rash if needed. An EpiPen  was prescribed for emergency use if you develop sudden tongue or throat swelling, trouble swallowing, or severe worsening of your reaction. If you ever have to use the EpiPen , call 911 immediately or have someone drive you to the emergency department.  At home, you should avoid scratching the rash, as this can make the itching worse or cause infection. Use fragrance-free, gentle skin care products such as oatmeal-based lotions (for example, Aveeno). Taking cool showers or applying cool compresses can also provide relief from itching and swelling. Try to pay attention to whether symptoms recur around certain foods, drinks, or exposures, and make a note of these so they can be reviewed by a specialist.  You should follow up with an allergy and asthma specialist for further evaluation to help identify the trigger for your reactions.  Go to the emergency department right away if you develop swelling of the tongue or throat, difficulty breathing, difficulty swallowing, chest tightness, dizziness, or if your symptoms worsen despite treatment.     ED Prescriptions     Medication Sig Dispense Auth. Provider   EPINEPHrine  0.3 mg/0.3 mL IJ SOAJ injection Inject 0.3 mg into the muscle as needed for anaphylaxis. Call 911 or have someone drive you to the emergency department immediately after use 1 each Iola Lukes, FNP   hydrOXYzine  (ATARAX ) 25 MG tablet Take 1 tablet (25 mg total) by mouth at bedtime for 10 days. 10 tablet Iola Lukes, FNP   predniSONE  (DELTASONE ) 20 MG tablet Take 2 tablets (40 mg total) by mouth daily for 5 days. 10 tablet Iola Lukes, FNP      PDMP not reviewed this encounter.   Iola Lukes, [place] 03/30/24 [phone]

## 2024-03-30 NOTE — Discharge Instructions (Addendum)
 You were seen today for itching, hives, and swelling of your upper lip that has been occurring intermittently over the past 2 days.  During your evaluation today, there was no notable hives or lip swelling.  However, your reported symptoms are most consistent with an allergic reaction. A steroid injection was given to help calm the reaction, and you were prescribed hydroxyzine  to take at night for itching. You may continue using hydrocortisone cream on the rash if needed. An EpiPen  was prescribed for emergency use if you develop sudden tongue or throat swelling, trouble swallowing, or severe worsening of your reaction. If you ever have to use the EpiPen , call 911 immediately or have someone drive you to the emergency department.  At home, you should avoid scratching the rash, as this can make the itching worse or cause infection. Use fragrance-free, gentle skin care products such as oatmeal-based lotions (for example, Aveeno). Taking cool showers or applying cool compresses can also provide relief from itching and swelling. Try to pay attention to whether symptoms recur around certain foods, drinks, or exposures, and make a note of these so they can be reviewed by a specialist.  You should follow up with an allergy and asthma specialist for further evaluation to help identify the trigger for your reactions.  Go to the emergency department right away if you develop swelling of the tongue or throat, difficulty breathing, difficulty swallowing, chest tightness, dizziness, or if your symptoms worsen despite treatment.

## 2024-03-30 NOTE — ED Triage Notes (Signed)
 Patient presenting with hives that started on her arms and has now spread all over the body onset 2 days ago. Denies any new foods, meds, or products.  Prescriptions or OTC medications tried: Yes- Hydrocortisone cream    with little relief

## 2024-04-17 ENCOUNTER — Telehealth (INDEPENDENT_AMBULATORY_CARE_PROVIDER_SITE_OTHER): Payer: Self-pay | Admitting: Primary Care

## 2024-04-17 ENCOUNTER — Ambulatory Visit (INDEPENDENT_AMBULATORY_CARE_PROVIDER_SITE_OTHER): Admitting: Primary Care

## 2024-04-17 NOTE — Telephone Encounter (Signed)
 Called pt to reschedule appt. Pt did not answer and LVM

## 2024-07-02 ENCOUNTER — Encounter (HOSPITAL_COMMUNITY): Payer: Self-pay

## 2024-07-02 ENCOUNTER — Other Ambulatory Visit: Payer: Self-pay

## 2024-07-02 ENCOUNTER — Emergency Department (HOSPITAL_COMMUNITY)

## 2024-07-02 ENCOUNTER — Emergency Department (HOSPITAL_COMMUNITY)
Admission: EM | Admit: 2024-07-02 | Discharge: 2024-07-02 | Disposition: A | Discharge status: Home / Self Care | Attending: Emergency Medicine | Admitting: Emergency Medicine

## 2024-07-02 DIAGNOSIS — R059 Cough, unspecified: Secondary | ICD-10-CM | POA: Diagnosis not present

## 2024-07-02 DIAGNOSIS — J441 Chronic obstructive pulmonary disease with (acute) exacerbation: Secondary | ICD-10-CM | POA: Insufficient documentation

## 2024-07-02 DIAGNOSIS — I2693 Single subsegmental pulmonary embolism without acute cor pulmonale: Secondary | ICD-10-CM | POA: Insufficient documentation

## 2024-07-02 DIAGNOSIS — R0602 Shortness of breath: Secondary | ICD-10-CM | POA: Diagnosis not present

## 2024-07-02 DIAGNOSIS — E876 Hypokalemia: Secondary | ICD-10-CM | POA: Insufficient documentation

## 2024-07-02 DIAGNOSIS — R0600 Dyspnea, unspecified: Secondary | ICD-10-CM | POA: Diagnosis not present

## 2024-07-02 LAB — CBC WITH DIFFERENTIAL/PLATELET
Abs Immature Granulocytes: 0.03 K/uL (ref 0.00–0.07)
Basophils Absolute: 0.1 K/uL (ref 0.0–0.1)
Basophils Relative: 1 %
Eosinophils Absolute: 0.2 K/uL (ref 0.0–0.5)
Eosinophils Relative: 3 %
HCT: 50.5 % — ABNORMAL HIGH (ref 36.0–46.0)
Hemoglobin: 17.1 g/dL — ABNORMAL HIGH (ref 12.0–15.0)
Immature Granulocytes: 0 %
Lymphocytes Relative: 11 %
Lymphs Abs: 0.8 K/uL (ref 0.7–4.0)
MCH: 32.9 pg (ref 26.0–34.0)
MCHC: 33.9 g/dL (ref 30.0–36.0)
MCV: 97.1 fL (ref 80.0–100.0)
Monocytes Absolute: 0.6 K/uL (ref 0.1–1.0)
Monocytes Relative: 8 %
Neutro Abs: 5.6 K/uL (ref 1.7–7.7)
Neutrophils Relative %: 77 %
Platelets: 191 K/uL (ref 150–400)
RBC: 5.2 MIL/uL — ABNORMAL HIGH (ref 3.87–5.11)
RDW: 13.7 % (ref 11.5–15.5)
WBC: 7.2 K/uL (ref 4.0–10.5)
nRBC: 0 % (ref 0.0–0.2)

## 2024-07-02 LAB — I-STAT CHEM 8, ED
BUN: 5 mg/dL — ABNORMAL LOW (ref 8–23)
Calcium, Ion: 1.07 mmol/L — ABNORMAL LOW (ref 1.15–1.40)
Chloride: 98 mmol/L (ref 98–111)
Creatinine, Ser: 0.7 mg/dL (ref 0.44–1.00)
Glucose, Bld: 108 mg/dL — ABNORMAL HIGH (ref 70–99)
HCT: 52 % — ABNORMAL HIGH (ref 36.0–46.0)
Hemoglobin: 17.7 g/dL — ABNORMAL HIGH (ref 12.0–15.0)
Potassium: 3 mmol/L — ABNORMAL LOW (ref 3.5–5.1)
Sodium: 137 mmol/L (ref 135–145)
TCO2: 23 mmol/L (ref 22–32)

## 2024-07-02 LAB — RESP PANEL BY RT-PCR (RSV, FLU A&B, COVID)  RVPGX2
Influenza A by PCR: NEGATIVE
Influenza B by PCR: NEGATIVE
Resp Syncytial Virus by PCR: NEGATIVE
SARS Coronavirus 2 by RT PCR: NEGATIVE

## 2024-07-02 LAB — BASIC METABOLIC PANEL WITH GFR
Anion gap: 10 (ref 5–15)
BUN: 5 mg/dL — ABNORMAL LOW (ref 8–23)
CO2: 24 mmol/L (ref 22–32)
Calcium: 9 mg/dL (ref 8.9–10.3)
Chloride: 99 mmol/L (ref 98–111)
Creatinine, Ser: 0.77 mg/dL (ref 0.44–1.00)
GFR, Estimated: >60 mL/min (ref >60)
Glucose, Bld: 108 mg/dL — ABNORMAL HIGH (ref 70–99)
Potassium: 2.9 mmol/L — ABNORMAL LOW (ref 3.5–5.1)
Sodium: 133 mmol/L — ABNORMAL LOW (ref 135–145)

## 2024-07-02 MED ORDER — MAGNESIUM SULFATE 2 GM/50ML IV SOLN
2.0000 g | Freq: Once | INTRAVENOUS | Status: AC
  Administered 2024-07-02: 2 g via INTRAVENOUS
  Filled 2024-07-02: qty 50

## 2024-07-02 MED ORDER — IOHEXOL 350 MG/ML SOLN
75.0000 mL | Freq: Once | INTRAVENOUS | Status: AC | PRN
  Administered 2024-07-02: 75 mL via INTRAVENOUS

## 2024-07-02 MED ORDER — PREDNISONE 20 MG PO TABS: 40.0000 mg | ORAL_TABLET | Freq: Every day | ORAL | 0 refills | Status: AC

## 2024-07-02 MED ORDER — POTASSIUM CHLORIDE 20 MEQ PO PACK
60.0000 meq | PACK | ORAL | Status: AC
  Administered 2024-07-02: 60 meq via ORAL
  Filled 2024-07-02: qty 3

## 2024-07-02 MED ORDER — ALBUTEROL SULFATE (2.5 MG/3ML) 0.083% IN NEBU
5.0000 mg | INHALATION_SOLUTION | Freq: Once | RESPIRATORY_TRACT | Status: AC
  Administered 2024-07-02: 5 mg via RESPIRATORY_TRACT
  Filled 2024-07-02: qty 6

## 2024-07-02 MED ORDER — POTASSIUM CHLORIDE CRYS ER 20 MEQ PO TBCR
40.0000 meq | EXTENDED_RELEASE_TABLET | Freq: Once | ORAL | Status: AC
  Administered 2024-07-02: 40 meq via ORAL
  Filled 2024-07-02: qty 2

## 2024-07-02 MED ORDER — OXYCODONE HCL 5 MG PO TABS
5.0000 mg | ORAL_TABLET | ORAL | Status: AC
  Administered 2024-07-02: 5 mg via ORAL
  Filled 2024-07-02: qty 1

## 2024-07-02 MED ORDER — ALBUTEROL SULFATE HFA 108 (90 BASE) MCG/ACT IN AERS
2.0000 | INHALATION_SPRAY | Freq: Once | RESPIRATORY_TRACT | Status: AC
  Administered 2024-07-02: 2 via RESPIRATORY_TRACT
  Filled 2024-07-02: qty 6.7

## 2024-07-02 MED ORDER — IPRATROPIUM-ALBUTEROL 0.5-2.5 (3) MG/3ML IN SOLN
6.0000 mL | Freq: Once | RESPIRATORY_TRACT | Status: AC
  Administered 2024-07-02: 6 mL via RESPIRATORY_TRACT
  Filled 2024-07-02: qty 3

## 2024-07-02 MED ORDER — POTASSIUM CHLORIDE CRYS ER 20 MEQ PO TBCR: 20.0000 meq | EXTENDED_RELEASE_TABLET | Freq: Two times a day (BID) | ORAL | 0 refills | Status: AC

## 2024-07-02 MED ORDER — APIXABAN (ELIQUIS) EDUCATION KIT FOR DVT/PE PATIENTS: PACK | Freq: Once | Status: DC

## 2024-07-02 MED ORDER — APIXABAN (ELIQUIS) VTE STARTER PACK (10MG AND 5MG): ORAL_TABLET | ORAL | 0 refills | Status: AC

## 2024-07-02 MED ORDER — APIXABAN 5 MG PO TABS
10.0000 mg | ORAL_TABLET | ORAL | Status: AC
  Administered 2024-07-02: 10 mg via ORAL
  Filled 2024-07-02: qty 2

## 2024-07-02 MED ORDER — DOXYCYCLINE HYCLATE 100 MG PO TABS
100.0000 mg | ORAL_TABLET | Freq: Once | ORAL | Status: AC
  Administered 2024-07-02: 100 mg via ORAL
  Filled 2024-07-02: qty 1

## 2024-07-02 MED ORDER — LACTATED RINGERS IV BOLUS
1000.0000 mL | Freq: Once | INTRAVENOUS | Status: AC
  Administered 2024-07-02: 1000 mL via INTRAVENOUS

## 2024-07-02 MED ORDER — METHYLPREDNISOLONE SODIUM SUCC 125 MG IJ SOLR
125.0000 mg | Freq: Once | INTRAMUSCULAR | Status: AC
  Administered 2024-07-02: 125 mg via INTRAVENOUS
  Filled 2024-07-02: qty 2

## 2024-07-02 NOTE — ED Provider Notes (Signed)
 [place] [facility] AT [facility] Provider Note   CSN: [id number] Arrival date & time: 07/02/24  9094     Patient presents with: Cough and Nasal Congestion   Jean Daugherty  is a 63 y.o. female.   63 year old female history of COPD and tobacco use who presents to the emergency department with congestion, cough, and shortness of breath.  Several family members sick with similar symptoms.  On Thursday started feeling sick.  Then developed congestion and a dry cough.  Also having some shortness of breath.  No chest pain.  No fevers or chills.  Ran out of her inhaler a while ago.  Says that she has lost her appetite as well.  No history of intubations for her COPD       Prior to Admission medications   Medication Sig Start Date End Date Taking? Authorizing Provider  APIXABAN WINN) VTE STARTER PACK (10MG  AND 5MG ) Take as directed on package: start with two-5mg  tablets twice daily for 7 days. On day 8, switch to one-5mg  tablet twice daily. 07/02/24  Yes Yolande Lamar BROCKS, MD  potassium chloride SA (KLOR-CON M) 20 MEQ tablet Take 1 tablet (20 mEq total) by mouth 2 (two) times daily for 4 days. 07/02/24 07/06/24 Yes Yolande Lamar BROCKS, MD  predniSONE  (DELTASONE ) 20 MG tablet Take 2 tablets (40 mg total) by mouth daily for 4 days. 07/02/24 07/06/24 Yes Yolande Lamar BROCKS, MD  albuterol  (VENTOLIN  HFA) [phone] Base) MCG/ACT inhaler Inhale 2 puffs into the lungs every 4 (four) hours as needed for wheezing or shortness of breath. 09/09/23   Allwardt, Alyssa M, PA-C  bisoprolol-hydrochlorothiazide (ZIAC) 10-6.25 MG tablet Take 1 tablet by mouth daily. 08/04/23   [provider]  Cholecalciferol 1.25 MG (50000 UT) capsule Take by mouth. 10/14/22   [provider]  diphenhydramine -acetaminophen  (TYLENOL  PM) 25-500 MG TABS tablet Take 1 tablet by mouth at bedtime as needed (sleep).    [provider]  EPINEPHrine  0.3 mg/0.3 mL IJ SOAJ injection Inject  0.3 mg into the muscle as needed for anaphylaxis. Call 911 or have someone drive you to the emergency department immediately after use 03/30/24   Murrill, Samantha, FNP  fluticasone (FLONASE) 50 MCG/ACT nasal spray Use 1 squirt to each nostril daily as needed for sinus symptoms 09/29/22   [provider]    Allergies: Patient has no known allergies.    Review of Systems  Updated Vital Signs BP 112/74   Pulse (!) 116   Temp 98.3 F (36.8 C) (Oral)   Resp 20   Ht 5' 6.5 (1.689 m)   Wt 67.6 kg   SpO2 95%   BMI 23.69 kg/m   Physical Exam Vitals and nursing note reviewed.  Constitutional:      General: She is not in acute distress.    Appearance: She is well-developed.  HENT:     Head: Normocephalic and atraumatic.     Right Ear: External ear normal.     Left Ear: External ear normal.     Nose: Congestion and rhinorrhea present.  Eyes:     Extraocular Movements: Extraocular movements intact.     Conjunctiva/sclera: Conjunctivae normal.     Pupils: Pupils are equal, round, and reactive to light.  Cardiovascular:     Rate and Rhythm: Regular rhythm. Tachycardia present.     Heart sounds: No murmur heard. Pulmonary:     Effort: Pulmonary effort is normal. No respiratory distress.     Breath sounds: Wheezing (  Diffuse inspiratory and expiratory) present.  Musculoskeletal:     Cervical back: Normal range of motion and neck supple.     Right lower leg: No edema.     Left lower leg: No edema.  Skin:    General: Skin is warm and dry.  Neurological:     Mental Status: She is alert and oriented to person, place, and time. Mental status is at baseline.  Psychiatric:        Mood and Affect: Mood normal.     (all labs ordered are listed, but only abnormal results are displayed) Labs Reviewed  BASIC METABOLIC PANEL WITH GFR - Abnormal; Notable for the following components:      Result Value   Sodium 133 (*)    Potassium 2.9 (*)    Glucose, Bld 108 (*)    BUN 5 (*)     All other components within normal limits  CBC WITH DIFFERENTIAL/PLATELET - Abnormal; Notable for the following components:   RBC 5.20 (*)    Hemoglobin 17.1 (*)    HCT 50.5 (*)    All other components within normal limits  I-STAT CHEM 8, ED - Abnormal; Notable for the following components:   Potassium 3.0 (*)    BUN 5 (*)    Glucose, Bld 108 (*)    Calcium, Ion 1.07 (*)    Hemoglobin 17.7 (*)    HCT 52.0 (*)    All other components within normal limits  RESP PANEL BY RT-PCR (RSV, FLU A&B, COVID)  [id number]    EKG: EKG Interpretation Date/Time:  Sunday July 02 2024 09:50:04 EST Ventricular Rate:  98 PR Interval:  124 QRS Duration:  74 QT Interval:  365 QTC Calculation: 466 R Axis:   72  Text Interpretation: Sinus rhythm Right atrial enlargement Borderline repol abnormality, diffuse leads Confirmed by Yolande Charleston [phone]) on 07/02/2024 10:15:34 AM  Radiology: CT Angio Chest PE W and/or Wo Contrast Result Date: 07/02/2024 CLINICAL DATA:  Hives beginning on the arms putting to the rest the body beginning 2 days ago. Patient also reports difficulty breathing. EXAM: CT ANGIOGRAPHY CHEST WITH CONTRAST TECHNIQUE: Multidetector CT imaging of the chest was performed using the standard protocol during bolus administration of intravenous contrast. Multiplanar CT image reconstructions and MIPs were obtained to evaluate the vascular anatomy. RADIATION DOSE REDUCTION: This exam was performed according to the departmental dose-optimization program which includes automated exposure control, adjustment of the mA and/or kV according to patient size and/or use of iterative reconstruction technique. CONTRAST:  75mL OMNIPAQUE IOHEXOL 350 MG/ML SOLN COMPARISON:  07/19/2020.  Current chest radiograph. FINDINGS: Cardiovascular: Pulmonary arteries are well opacified. There is a single small pulmonary embolism to the posterior basilar segment of the right lower lobe, nonocclusive. No other evidence of a  pulmonary embolism. Heart is normal in size and configuration. Mild left coronary artery calcifications. No pericardial effusion. Great vessels are normal in caliber. Mediastinum/Nodes: No neck base, mediastinal or hilar masses. No enlarged lymph nodes. Trachea and esophagus are unremarkable. Lungs/Pleura: Several tiny scattered nodules, without convincing change from the prior CT. Lung masses or suspicious nodules. No lung consolidation or edema. No pleural effusion or pneumothorax. Upper Abdomen: Unremarkable. Musculoskeletal: No fracture or acute finding.  No bone lesion. Review of the MIP images confirms the above findings. IMPRESSION: 1. Single small nonocclusive pulmonary embolus to the posterior basilar segmental artery. No other evidence of a pulmonary embolism. 2. No other acute or significant abnormality. Electronically Signed   By: Alm  Ormond M.D.   On: 07/02/2024 13:58   DG Chest Port 1 View Result Date: 07/02/2024 CLINICAL DATA:  Cough.  Shortness of breath. EXAM: PORTABLE CHEST 1 VIEW COMPARISON:  07/12/2019 FINDINGS: The heart size and mediastinal contours are within normal limits. Both lungs are clear. The visualized skeletal structures are unremarkable. IMPRESSION: No active disease. Electronically Signed   By: Norleen DELENA Kil M.D.   On: 07/02/2024 10:22     Procedures   Medications Ordered in the ED  methylPREDNISolone  sodium succinate (SOLU-MEDROL ) 125 mg/2 mL injection 125 mg (125 mg Intravenous Given 07/02/24 0959)  ipratropium-albuterol  (DUONEB) 0.5-2.5 (3) MG/3ML nebulizer solution 6 mL (6 mLs Nebulization Given 07/02/24 1000)  lactated ringers bolus 1,000 mL (0 mLs Intravenous Stopped 07/02/24 1041)  potassium chloride SA (KLOR-CON M) CR tablet 40 mEq (40 mEq Oral Given 07/02/24 1042)  magnesium sulfate IVPB 2 g 50 mL (0 g Intravenous Stopped 07/02/24 1221)  potassium chloride (KLOR-CON) packet 60 mEq (60 mEq Oral Given 07/02/24 1121)  lactated ringers bolus 1,000 mL (0 mLs  Intravenous Stopped 07/02/24 1221)  albuterol  (PROVENTIL ) (2.5 MG/3ML) 0.083% nebulizer solution 5 mg (5 mg Nebulization Given 07/02/24 1122)  oxyCODONE  (Oxy IR/ROXICODONE ) immediate release tablet 5 mg (5 mg Oral Given 07/02/24 1224)  doxycycline (VIBRA-TABS) tablet 100 mg (100 mg Oral Given 07/02/24 1224)  iohexol (OMNIPAQUE) 350 MG/ML injection 75 mL (75 mLs Intravenous Contrast Given 07/02/24 1331)  apixaban (ELIQUIS) tablet 10 mg (10 mg Oral Given 07/02/24 1426)  albuterol  (VENTOLIN  HFA) 108 (90 Base) MCG/ACT inhaler 2 puff (2 puffs Inhalation Given 07/02/24 1434)    Clinical Course as of 07/02/24 1446  Sun Jul 02, 2024  1221 No complaining of left-sided chest pain.  Still tachycardic.  Will get a CTA [RP]    Clinical Course User Index [RP] Yolande Lamar BROCKS, MD                                 Medical Decision Making Amount and/or Complexity of Data Reviewed Labs: ordered. Radiology: ordered.  Risk Prescription drug management.   Sharay Jowers  is a 63 year old female history of COPD and tobacco use who presents to the emergency department with congestion, cough, and shortness of breath.   Initial Ddx:  COPD, URI, pneumonia, PE  MDM/Course:  Patient presents emergency department with URI type symptoms.  Also is having some shortness of breath.  Initially was denying chest pain.  On arrival was tachycardic to 115.  Satting well on room air and not in respiratory distress.  Does have diffuse wheezing bilaterally.  She was given Solu-Medrol , DuoNebs, and albuterol  afterwards with some IV fluids and upon re-evaluation remained tachycardic.  Breathing had somewhat improved.  Was given magnesium and additional nebulizer but remains tachycardic.  Then started complaining of some left-sided rib pain that was worsened when she was breathing so CTA was obtained which does show small subsegmental PE but is on the right side.  No signs of right heart strain.  Lab work also showed  hypokalemia which was replenished orally.  COVID and flu negative.  No signs of pneumonia on her x-ray.  Performed shared decision making with the patient regarding disposition.  She is requesting to go home at this point in time which I think is reasonable given the fact that she is currently on room air and does not have any signs of right heart strain.  She was given her first dose  of Eliquis in the emergency department and was visited by pharmacy to go over how to take this medication and side effects to look out for.  Was given an inhaler in the emergency department as well.  Discharged home with a prescription for Eliquis and prednisone .  Will have her follow-up with her primary doctor as well as hematology oncology to figure out why she had this blood clot.  At this point in time she denies any recent surgery, hormone use, or history of cancer  This patient presents to the ED for concern of complaints listed in HPI, this involves an extensive number of treatment options, and is a complaint that carries with it a high risk of complications and morbidity. Disposition including potential need for admission considered.   Dispo: DC Home. Return precautions discussed including, but not limited to, those listed in the AVS. Allowed pt time to ask questions which were answered fully prior to dc.  Records reviewed Outpatient Clinic Notes The following labs were independently interpreted: Chemistry and show hypokalemia I independently reviewed the following imaging with scope of interpretation limited to determining acute life threatening conditions related to emergency care: Chest x-ray and agree with the radiologist interpretation with the following exceptions: none I personally reviewed and interpreted cardiac monitoring: sinus tachycardia I personally reviewed and interpreted the pt's EKG: see above for interpretation  I have reviewed the patients home medications and made adjustments as  needed Consults: pharmacy  Portions of this note were generated with Dragon dictation software. Dictation errors may occur despite best attempts at proofreading.     Final diagnoses:  COPD exacerbation (HCC)  Single subsegmental pulmonary embolism without acute cor pulmonale (HCC)  Hypokalemia    ED Discharge Orders          Ordered    APIXABAN (ELIQUIS) VTE STARTER PACK (10MG  AND 5MG )       Note to Pharmacy: If starter pack unavailable, substitute with seventy-four 5 mg apixaban tabs following the above SIG directions.   07/02/24 1418    predniSONE  (DELTASONE ) 20 MG tablet  Daily        07/02/24 1418    potassium chloride SA (KLOR-CON M) 20 MEQ tablet  2 times daily        07/02/24 1418               Yolande Lamar BROCKS, MD 07/02/24 1446

## 2024-07-02 NOTE — Discharge Instructions (Addendum)
 You were seen for your COPD exacerbation and pulmonary embolism in the emergency department.   At home, please take the Eliquis we have prescribed you for your PE.  Take the prednisone  we have prescribed you for your COPD exacerbation.  Please also use the inhalers as needed for any shortness of breath.  You will need to be on potassium for the next 4 days as well because it was low in the emergency department.  Check your MyChart online for the results of any tests that had not resulted by the time you left the emergency department.   Follow-up with your primary doctor in 2-3 days regarding your visit.  Please follow-up with hematology oncology to figure out why you developed the blood clot.  Return immediately to the emergency department if you experience any of the following: Worsening difficulty breathing, chest pain, or any other concerning symptoms.    Thank you for visiting our Emergency Department. It was a pleasure taking care of you today.

## 2024-07-02 NOTE — ED Triage Notes (Signed)
 Pt c,o productive cough and congestion since Friday morning, Denies fever
